# Patient Record
Sex: Male | Born: 1945 | Race: White | Hispanic: No | Marital: Married | Smoking: Current every day smoker
Health system: Southern US, Community
[De-identification: ages and names within clinical notes are randomized; demographics above are authoritative.]

## PROBLEM LIST (undated history)

## (undated) DIAGNOSIS — G8929 Other chronic pain: Secondary | ICD-10-CM

## (undated) DIAGNOSIS — K219 Gastro-esophageal reflux disease without esophagitis: Secondary | ICD-10-CM

## (undated) DIAGNOSIS — K589 Irritable bowel syndrome without diarrhea: Secondary | ICD-10-CM

## (undated) DIAGNOSIS — J449 Chronic obstructive pulmonary disease, unspecified: Secondary | ICD-10-CM

## (undated) DIAGNOSIS — J9819 Other pulmonary collapse: Secondary | ICD-10-CM

## (undated) DIAGNOSIS — M549 Dorsalgia, unspecified: Secondary | ICD-10-CM

## (undated) DIAGNOSIS — M1990 Unspecified osteoarthritis, unspecified site: Secondary | ICD-10-CM

## (undated) HISTORY — PX: SKIN GRAFT: SHX250

---

## 2003-07-29 ENCOUNTER — Emergency Department (HOSPITAL_COMMUNITY): Admission: EM | Admit: 2003-07-29 | Discharge: 2003-07-30 | Payer: Self-pay | Admitting: Emergency Medicine

## 2004-08-10 ENCOUNTER — Emergency Department (HOSPITAL_COMMUNITY): Admission: EM | Admit: 2004-08-10 | Discharge: 2004-08-10 | Payer: Self-pay | Admitting: Emergency Medicine

## 2005-12-16 DIAGNOSIS — S2249XA Multiple fractures of ribs, unspecified side, initial encounter for closed fracture: Secondary | ICD-10-CM | POA: Insufficient documentation

## 2007-11-03 DIAGNOSIS — J449 Chronic obstructive pulmonary disease, unspecified: Secondary | ICD-10-CM

## 2007-11-03 DIAGNOSIS — J4489 Other specified chronic obstructive pulmonary disease: Secondary | ICD-10-CM | POA: Insufficient documentation

## 2008-03-04 ENCOUNTER — Ambulatory Visit: Payer: Self-pay | Admitting: Nurse Practitioner

## 2008-03-04 DIAGNOSIS — S99919A Unspecified injury of unspecified ankle, initial encounter: Secondary | ICD-10-CM

## 2008-03-04 DIAGNOSIS — S99929A Unspecified injury of unspecified foot, initial encounter: Secondary | ICD-10-CM

## 2008-03-04 DIAGNOSIS — S8990XA Unspecified injury of unspecified lower leg, initial encounter: Secondary | ICD-10-CM | POA: Insufficient documentation

## 2008-03-04 DIAGNOSIS — L03019 Cellulitis of unspecified finger: Secondary | ICD-10-CM

## 2008-03-04 DIAGNOSIS — M79609 Pain in unspecified limb: Secondary | ICD-10-CM

## 2008-03-04 DIAGNOSIS — E299 Testicular dysfunction, unspecified: Secondary | ICD-10-CM | POA: Insufficient documentation

## 2008-03-08 ENCOUNTER — Ambulatory Visit (HOSPITAL_COMMUNITY): Admission: RE | Admit: 2008-03-08 | Discharge: 2008-03-08 | Payer: Self-pay | Admitting: Internal Medicine

## 2008-03-08 DIAGNOSIS — F17200 Nicotine dependence, unspecified, uncomplicated: Secondary | ICD-10-CM

## 2008-03-16 ENCOUNTER — Ambulatory Visit: Payer: Self-pay | Admitting: Nurse Practitioner

## 2008-03-16 DIAGNOSIS — M5137 Other intervertebral disc degeneration, lumbosacral region: Secondary | ICD-10-CM

## 2008-03-16 DIAGNOSIS — H269 Unspecified cataract: Secondary | ICD-10-CM

## 2008-03-16 LAB — CONVERTED CEMR LAB
ALT: <8 units/L (ref 0–53)
AST: 13 units/L (ref 0–37)
Alkaline Phosphatase: 80 units/L (ref 39–117)
Basophils Absolute: 0 10*3/uL (ref 0.0–0.1)
Basophils Relative: 0 % (ref 0–1)
Chloride: 103 meq/L (ref 96–112)
Creatinine, Ser: 0.8 mg/dL (ref 0.40–1.50)
Eosinophils Relative: 3 % (ref 0–5)
Hemoglobin: 14.2 g/dL (ref 13.0–17.0)
LDL Cholesterol: 89 mg/dL (ref 0–99)
MCHC: 33.6 g/dL (ref 30.0–36.0)
Monocytes Absolute: 0.9 10*3/uL (ref 0.1–1.0)
Neutro Abs: 6.4 10*3/uL (ref 1.7–7.7)
RDW: 12.7 % (ref 11.5–15.5)
TSH: 0.979 microintl units/mL (ref 0.350–5.50)
Total Bilirubin: 0.6 mg/dL (ref 0.3–1.2)
Total CHOL/HDL Ratio: 3.1
VLDL: 18 mg/dL (ref 0–40)

## 2008-03-17 ENCOUNTER — Encounter (INDEPENDENT_AMBULATORY_CARE_PROVIDER_SITE_OTHER): Payer: Self-pay | Admitting: Nurse Practitioner

## 2008-03-23 ENCOUNTER — Encounter (INDEPENDENT_AMBULATORY_CARE_PROVIDER_SITE_OTHER): Payer: Self-pay | Admitting: Nurse Practitioner

## 2008-04-06 ENCOUNTER — Encounter (INDEPENDENT_AMBULATORY_CARE_PROVIDER_SITE_OTHER): Payer: Self-pay | Admitting: Nurse Practitioner

## 2008-04-12 ENCOUNTER — Encounter (INDEPENDENT_AMBULATORY_CARE_PROVIDER_SITE_OTHER): Payer: Self-pay | Admitting: Urology

## 2008-04-12 ENCOUNTER — Ambulatory Visit (HOSPITAL_BASED_OUTPATIENT_CLINIC_OR_DEPARTMENT_OTHER): Admission: RE | Admit: 2008-04-12 | Discharge: 2008-04-12 | Payer: Self-pay | Admitting: Urology

## 2008-04-15 ENCOUNTER — Ambulatory Visit: Payer: Self-pay | Admitting: Nurse Practitioner

## 2008-04-25 ENCOUNTER — Telehealth (INDEPENDENT_AMBULATORY_CARE_PROVIDER_SITE_OTHER): Payer: Self-pay | Admitting: Nurse Practitioner

## 2008-04-29 ENCOUNTER — Encounter (INDEPENDENT_AMBULATORY_CARE_PROVIDER_SITE_OTHER): Payer: Self-pay | Admitting: Nurse Practitioner

## 2008-05-02 ENCOUNTER — Telehealth (INDEPENDENT_AMBULATORY_CARE_PROVIDER_SITE_OTHER): Payer: Self-pay | Admitting: Nurse Practitioner

## 2008-05-19 ENCOUNTER — Encounter (INDEPENDENT_AMBULATORY_CARE_PROVIDER_SITE_OTHER): Payer: Self-pay | Admitting: Nurse Practitioner

## 2008-05-25 ENCOUNTER — Encounter (INDEPENDENT_AMBULATORY_CARE_PROVIDER_SITE_OTHER): Payer: Self-pay | Admitting: Nurse Practitioner

## 2008-05-27 ENCOUNTER — Ambulatory Visit: Payer: Self-pay | Admitting: Nurse Practitioner

## 2008-05-30 ENCOUNTER — Telehealth (INDEPENDENT_AMBULATORY_CARE_PROVIDER_SITE_OTHER): Payer: Self-pay | Admitting: *Deleted

## 2008-06-14 ENCOUNTER — Encounter (INDEPENDENT_AMBULATORY_CARE_PROVIDER_SITE_OTHER): Payer: Self-pay | Admitting: Nurse Practitioner

## 2008-06-21 ENCOUNTER — Encounter (INDEPENDENT_AMBULATORY_CARE_PROVIDER_SITE_OTHER): Payer: Self-pay | Admitting: Nurse Practitioner

## 2008-06-24 ENCOUNTER — Ambulatory Visit: Payer: Self-pay | Admitting: Nurse Practitioner

## 2008-06-24 DIAGNOSIS — N3000 Acute cystitis without hematuria: Secondary | ICD-10-CM

## 2008-06-24 LAB — CONVERTED CEMR LAB
Protein, U semiquant: 30
Urobilinogen, UA: 0.2

## 2008-06-29 ENCOUNTER — Encounter (INDEPENDENT_AMBULATORY_CARE_PROVIDER_SITE_OTHER): Payer: Self-pay | Admitting: Nurse Practitioner

## 2008-07-01 ENCOUNTER — Ambulatory Visit (HOSPITAL_COMMUNITY): Admission: RE | Admit: 2008-07-01 | Discharge: 2008-07-01 | Payer: Self-pay | Admitting: Internal Medicine

## 2008-07-04 ENCOUNTER — Encounter (INDEPENDENT_AMBULATORY_CARE_PROVIDER_SITE_OTHER): Payer: Self-pay | Admitting: Nurse Practitioner

## 2011-04-30 NOTE — Op Note (Signed)
[name], [name]                ACCOUNT NO.:  [id number]   MEDICAL RECORD NO.:  [id number]          PATIENT TYPE:  AMB   LOCATION:  [facility]                         FACILITY:  [facility]   PHYSICIAN:  Lindaann Slough, M.D.  DATE OF BIRTH:  [birth year]   DATE OF PROCEDURE:  04/12/2008  DATE OF DISCHARGE:                               OPERATIVE REPORT   PREOPERATIVE DIAGNOSIS:  Left spermatocele.   POSTOPERATIVE DIAGNOSIS:  Left spermatocele.   PROCEDURE:  Left spermatocelectomy.   ATTENDING PHYSICIAN:  Danae Chen, M.D.   RESIDENT PHYSICIAN:  Dr. Delman Kitten.   ANESTHESIA:  General.   INDICATIONS FOR PROCEDURE:  The patient is a 65 year old white male who  has been followed by Dr. Brunilda Payor in clinic for a cyst-like mass in his left  hemiscrotum that is separate from his testicle.  It has been evaluated  thoroughly in the clinic and he has failed conservative management.  He  reports that is increasing in size and increasing in pain and requested  something else be done.  Surgical options were discussed with him  including risks, benefits, and consequences, and informed consent was  obtained preoperatively.   PROCEDURE IN DETAIL:  The patient is brought to the operating room,  placed in supine position.  He was correctly identified by his wristband  and appropriate time-out was taken.  IV antibiotics were administered  and general anesthesia was delivered.  Once adequately anesthetized, his  scrotum was clipped, prepped, and draped sterilely.  We began our  procedure by performing a thorough physical exam.  His right testicle  was palpably normal.  His left hemiscrotum demonstrated two masses, both  of approximately equal size and based on Dr. Madilyn Hook clinic exam, the  more cephalad and anterior mass was the mass in question.  He states  that it transilluminated in the clinic.  To palpation they felt similar.  We subsequently made a vertical incision in the left hemiscrotum over  the left  mass.  The incision was superficial.  It was carried down  deeper with Metzenbaum scissors.  We got down to the tunica vaginalis.  It was entered sharply and then we subsequently delivered the mass and  the testicle out of the left hemiscrotum.  We thoroughly inspected the  structures.  His left testicle and left epididymis, vas, and spermatic  cord all appeared normal.  This mass in question was approximately 4 cm  in length.  It was cystic and fluid-filled and soft, but it also had  somewhat of a lumpy, bumpy, irregular surface.  It appeared as if the  fluid inside of is amber-colored.  There was a defined plane in between  the head of the epididymis, the testicle, and the mass.  Likewise, we  subsequently developed this plane sharply using Metzenbaum scissors,  taking great care to not enter the cyst, as well as to avoid damaging  the testicle and epididymis.  We were successfully able to amputate the  cyst away from the testicle and epididymis without any harm to any of  the three structures.  The spermatocele was  passed off of the table.  We  then closely inspected all the normal anatomic structures.  There was no  injury appreciated.  All bleeding sites were addressed with  electrocautery.  The epididymal head was slightly disarticulated  from  its attachment to the scrotum, so we subsequently reapproximated it, as  well  as reapproximated the external spermatic fascia with one simple  running suture using 3-0 chromic taking great care to only reapproximate  the fascia and not to incorporate the deeper structures.  Once this was  done, we re-inspected for hemostasis.  There was no active bleeding so  likewise we re-delivered the testicle and spermatic cord and epididymis  back into the left hemiscrotum and closed it in two layers, taking great  care to eliminate any possible dead space that would allow a hematoma to  form.  The skin was closed with a simple running suture using 3-0   chromic and then collodion was placed on top of this suture, fluff  dressing was applied once the collodion was dry, and this marked the end  of our procedure.  He tolerated the procedure well.  There were no  complications.  Dr. Brunilda Payor was present and participated in all aspects of  the case.      Melina Schools, MD      Lindaann Slough, M.D.  Electronically Signed    JR/MEDQ  D:  04/12/2008  T:  04/12/2008  Job:  [id number]   cc:   Lindaann Slough, M.D.  Fax: [phone]

## 2011-09-10 LAB — URINALYSIS, ROUTINE W REFLEX MICROSCOPIC
Bilirubin Urine: NEGATIVE
Glucose, UA: NEGATIVE
Ketones, ur: NEGATIVE
Protein, ur: NEGATIVE
pH: 6.5

## 2011-09-10 LAB — URINE CULTURE: Colony Count: NO GROWTH

## 2011-09-10 LAB — COMPREHENSIVE METABOLIC PANEL
Albumin: 4
BUN: 6
Chloride: 101
Creatinine, Ser: 1
Glucose, Bld: 101 — ABNORMAL HIGH
Total Bilirubin: 1.1

## 2011-09-10 LAB — PSA: PSA: 1.16

## 2011-09-10 LAB — CBC
HCT: 42.4
Hemoglobin: 14.2
MCV: 93.3
Platelets: 257
RDW: 13.1

## 2011-09-10 LAB — URINE MICROSCOPIC-ADD ON

## 2011-09-10 LAB — PROTIME-INR: Prothrombin Time: 13.4

## 2011-09-10 LAB — APTT: aPTT: 33

## 2013-01-07 ENCOUNTER — Encounter (HOSPITAL_COMMUNITY): Payer: Self-pay | Admitting: *Deleted

## 2013-01-07 ENCOUNTER — Emergency Department (HOSPITAL_COMMUNITY): Payer: Medicare Other

## 2013-01-07 ENCOUNTER — Inpatient Hospital Stay (HOSPITAL_COMMUNITY)
Admission: EM | Admit: 2013-01-07 | Discharge: 2013-01-10 | DRG: 432 | Disposition: A | Discharge status: Home / Self Care | Payer: Medicare Other | Attending: Internal Medicine | Admitting: Internal Medicine

## 2013-01-07 DIAGNOSIS — Z681 Body mass index (BMI) 19 or less, adult: Secondary | ICD-10-CM

## 2013-01-07 DIAGNOSIS — Z8249 Family history of ischemic heart disease and other diseases of the circulatory system: Secondary | ICD-10-CM

## 2013-01-07 DIAGNOSIS — Z79899 Other long term (current) drug therapy: Secondary | ICD-10-CM

## 2013-01-07 DIAGNOSIS — N3000 Acute cystitis without hematuria: Secondary | ICD-10-CM

## 2013-01-07 DIAGNOSIS — F17200 Nicotine dependence, unspecified, uncomplicated: Secondary | ICD-10-CM

## 2013-01-07 DIAGNOSIS — F1020 Alcohol dependence, uncomplicated: Secondary | ICD-10-CM

## 2013-01-07 DIAGNOSIS — E43 Unspecified severe protein-calorie malnutrition: Secondary | ICD-10-CM

## 2013-01-07 DIAGNOSIS — K219 Gastro-esophageal reflux disease without esophagitis: Secondary | ICD-10-CM

## 2013-01-07 DIAGNOSIS — R188 Other ascites: Secondary | ICD-10-CM

## 2013-01-07 DIAGNOSIS — J449 Chronic obstructive pulmonary disease, unspecified: Secondary | ICD-10-CM | POA: Diagnosis present

## 2013-01-07 DIAGNOSIS — K7030 Alcoholic cirrhosis of liver without ascites: Principal | ICD-10-CM | POA: Diagnosis present

## 2013-01-07 DIAGNOSIS — M51379 Other intervertebral disc degeneration, lumbosacral region without mention of lumbar back pain or lower extremity pain: Secondary | ICD-10-CM | POA: Diagnosis present

## 2013-01-07 DIAGNOSIS — K766 Portal hypertension: Secondary | ICD-10-CM | POA: Diagnosis present

## 2013-01-07 DIAGNOSIS — Z66 Do not resuscitate: Secondary | ICD-10-CM | POA: Diagnosis present

## 2013-01-07 DIAGNOSIS — N390 Urinary tract infection, site not specified: Secondary | ICD-10-CM

## 2013-01-07 DIAGNOSIS — G8929 Other chronic pain: Secondary | ICD-10-CM | POA: Diagnosis present

## 2013-01-07 DIAGNOSIS — E871 Hypo-osmolality and hyponatremia: Secondary | ICD-10-CM

## 2013-01-07 DIAGNOSIS — K7460 Unspecified cirrhosis of liver: Secondary | ICD-10-CM

## 2013-01-07 DIAGNOSIS — J4489 Other specified chronic obstructive pulmonary disease: Secondary | ICD-10-CM | POA: Diagnosis present

## 2013-01-07 DIAGNOSIS — F1010 Alcohol abuse, uncomplicated: Secondary | ICD-10-CM

## 2013-01-07 DIAGNOSIS — M5137 Other intervertebral disc degeneration, lumbosacral region: Secondary | ICD-10-CM | POA: Diagnosis present

## 2013-01-07 HISTORY — DX: Unspecified osteoarthritis, unspecified site: M19.90

## 2013-01-07 HISTORY — DX: Other pulmonary collapse: J98.19

## 2013-01-07 HISTORY — DX: Dorsalgia, unspecified: M54.9

## 2013-01-07 HISTORY — DX: Gastro-esophageal reflux disease without esophagitis: K21.9

## 2013-01-07 HISTORY — DX: Chronic obstructive pulmonary disease, unspecified: J44.9

## 2013-01-07 HISTORY — DX: Irritable bowel syndrome, unspecified: K58.9

## 2013-01-07 HISTORY — DX: Other chronic pain: G89.29

## 2013-01-07 LAB — PROTIME-INR
INR: 1.56 — ABNORMAL HIGH (ref 0.00–1.49)
Prothrombin Time: 18.2 s — ABNORMAL HIGH (ref 11.6–15.2)

## 2013-01-07 LAB — COMPREHENSIVE METABOLIC PANEL WITH GFR
AST: 109 U/L — ABNORMAL HIGH (ref 0–37)
Albumin: 3.2 g/dL — ABNORMAL LOW (ref 3.5–5.2)
Chloride: 85 meq/L — ABNORMAL LOW (ref 96–112)
Creatinine, Ser: 0.48 mg/dL — ABNORMAL LOW (ref 0.50–1.35)
Potassium: 3.5 meq/L (ref 3.5–5.1)
Total Bilirubin: 2.3 mg/dL — ABNORMAL HIGH (ref 0.3–1.2)
Total Protein: 7.2 g/dL (ref 6.0–8.3)

## 2013-01-07 LAB — CBC WITH DIFFERENTIAL/PLATELET
Basophils Absolute: 0 K/uL (ref 0.0–0.1)
Basophils Relative: 1 % (ref 0–1)
Eosinophils Absolute: 0.1 K/uL (ref 0.0–0.7)
Eosinophils Relative: 1 % (ref 0–5)
HCT: 36.7 % — ABNORMAL LOW (ref 39.0–52.0)
Hemoglobin: 13.2 g/dL (ref 13.0–17.0)
Lymphocytes Relative: 19 % (ref 12–46)
Lymphs Abs: 1 10*3/uL (ref 0.7–4.0)
MCH: 34.2 pg — ABNORMAL HIGH (ref 26.0–34.0)
MCHC: 36 g/dL (ref 30.0–36.0)
MCV: 95.1 fL (ref 78.0–100.0)
Monocytes Absolute: 1.2 K/uL — ABNORMAL HIGH (ref 0.1–1.0)
Monocytes Relative: 22 % — ABNORMAL HIGH (ref 3–12)
Neutro Abs: 3.1 K/uL (ref 1.7–7.7)
Neutrophils Relative %: 57 % (ref 43–77)
Platelets: 162 10*3/uL (ref 150–400)
RBC: 3.86 MIL/uL — ABNORMAL LOW (ref 4.22–5.81)
RDW: 14 % (ref 11.5–15.5)
WBC: 5.4 10*3/uL (ref 4.0–10.5)

## 2013-01-07 LAB — URINALYSIS, ROUTINE W REFLEX MICROSCOPIC
Glucose, UA: NEGATIVE mg/dL
Ketones, ur: 15 mg/dL — AB
Nitrite: POSITIVE — AB
Protein, ur: 30 mg/dL — AB
Specific Gravity, Urine: 1.021 (ref 1.005–1.030)
Urobilinogen, UA: 4 mg/dL — ABNORMAL HIGH (ref 0.0–1.0)
pH: 6 (ref 5.0–8.0)

## 2013-01-07 LAB — COMPREHENSIVE METABOLIC PANEL
ALT: 27 U/L (ref 0–53)
Alkaline Phosphatase: 127 U/L — ABNORMAL HIGH (ref 39–117)
BUN: 4 mg/dL — ABNORMAL LOW (ref 6–23)
CO2: 28 mEq/L (ref 19–32)
Calcium: 9.3 mg/dL (ref 8.4–10.5)
GFR calc Af Amer: >90 mL/min (ref >90)
GFR calc non Af Amer: >90 mL/min (ref >90)
Glucose, Bld: 92 mg/dL (ref 70–99)
Sodium: 129 mEq/L — ABNORMAL LOW (ref 135–145)

## 2013-01-07 LAB — LIPASE, BLOOD: Lipase: 26 U/L (ref 11–59)

## 2013-01-07 LAB — ETHANOL: Alcohol, Ethyl (B): <11 mg/dL (ref 0–11)

## 2013-01-07 LAB — APTT: aPTT: 38 s — ABNORMAL HIGH (ref 24–37)

## 2013-01-07 LAB — URINE MICROSCOPIC-ADD ON

## 2013-01-07 MED ORDER — IPRATROPIUM BROMIDE 0.02 % IN SOLN: 0.5000 mg | Freq: Four times a day (QID) | RESPIRATORY_TRACT | Status: DC | PRN

## 2013-01-07 MED ORDER — LORAZEPAM 2 MG/ML IJ SOLN
0.0000 mg | Freq: Four times a day (QID) | INTRAMUSCULAR | Status: AC
  Administered 2013-01-07: 2 mg via INTRAVENOUS
  Filled 2013-01-07: qty 1

## 2013-01-07 MED ORDER — SODIUM CHLORIDE 0.9 % IV SOLN
INTRAVENOUS | Status: DC
  Administered 2013-01-07: 19:00:00 via INTRAVENOUS

## 2013-01-07 MED ORDER — SODIUM CHLORIDE 0.9 % IJ SOLN
3.0000 mL | Freq: Two times a day (BID) | INTRAMUSCULAR | Status: DC
  Administered 2013-01-09: 3 mL via INTRAVENOUS

## 2013-01-07 MED ORDER — PANTOPRAZOLE SODIUM 40 MG PO TBEC
40.0000 mg | DELAYED_RELEASE_TABLET | Freq: Two times a day (BID) | ORAL | Status: DC
  Administered 2013-01-07 – 2013-01-10 (×6): 40 mg via ORAL
  Filled 2013-01-07 (×5): qty 1

## 2013-01-07 MED ORDER — CEFTRIAXONE SODIUM 1 G IJ SOLR
1.0000 g | Freq: Once | INTRAMUSCULAR | Status: AC
  Administered 2013-01-07: 1 g via INTRAVENOUS
  Filled 2013-01-07: qty 10

## 2013-01-07 MED ORDER — ACETAMINOPHEN 325 MG PO TABS
325.0000 mg | ORAL_TABLET | Freq: Four times a day (QID) | ORAL | Status: DC | PRN
  Administered 2013-01-07: 325 mg via ORAL
  Filled 2013-01-07: qty 1

## 2013-01-07 MED ORDER — ONDANSETRON HCL 4 MG/2ML IJ SOLN: 4.0000 mg | Freq: Four times a day (QID) | INTRAMUSCULAR | Status: DC | PRN

## 2013-01-07 MED ORDER — SODIUM CHLORIDE 0.9 % IV SOLN
Freq: Once | INTRAVENOUS | Status: AC
  Administered 2013-01-07: 17:00:00 via INTRAVENOUS

## 2013-01-07 MED ORDER — LORAZEPAM 2 MG/ML IJ SOLN: 1.0000 mg | Freq: Four times a day (QID) | INTRAMUSCULAR | Status: DC | PRN

## 2013-01-07 MED ORDER — NICOTINE 14 MG/24HR TD PT24
14.0000 mg | MEDICATED_PATCH | Freq: Every day | TRANSDERMAL | Status: DC
  Administered 2013-01-07 – 2013-01-10 (×4): 14 mg via TRANSDERMAL
  Filled 2013-01-07 (×4): qty 1

## 2013-01-07 MED ORDER — FUROSEMIDE 40 MG PO TABS
40.0000 mg | ORAL_TABLET | Freq: Every day | ORAL | Status: DC
  Administered 2013-01-08 – 2013-01-10 (×3): 40 mg via ORAL
  Filled 2013-01-07 (×3): qty 1

## 2013-01-07 MED ORDER — LORAZEPAM 1 MG PO TABS
1.0000 mg | ORAL_TABLET | Freq: Four times a day (QID) | ORAL | Status: DC | PRN
  Administered 2013-01-10: 1 mg via ORAL
  Filled 2013-01-07: qty 1

## 2013-01-07 MED ORDER — VITAMIN B-1 100 MG PO TABS
100.0000 mg | ORAL_TABLET | Freq: Every day | ORAL | Status: DC
  Administered 2013-01-07 – 2013-01-10 (×4): 100 mg via ORAL
  Filled 2013-01-07 (×4): qty 1

## 2013-01-07 MED ORDER — ALBUTEROL SULFATE (5 MG/ML) 0.5% IN NEBU: 2.5000 mg | INHALATION_SOLUTION | Freq: Four times a day (QID) | RESPIRATORY_TRACT | Status: DC | PRN

## 2013-01-07 MED ORDER — ONDANSETRON HCL 4 MG PO TABS: 4.0000 mg | ORAL_TABLET | Freq: Four times a day (QID) | ORAL | Status: DC | PRN

## 2013-01-07 MED ORDER — MORPHINE SULFATE 2 MG/ML IJ SOLN
1.0000 mg | INTRAMUSCULAR | Status: DC | PRN
  Administered 2013-01-10: 1 mg via INTRAVENOUS
  Filled 2013-01-07: qty 1

## 2013-01-07 MED ORDER — LORAZEPAM 2 MG/ML IJ SOLN: 0.0000 mg | Freq: Two times a day (BID) | INTRAMUSCULAR | Status: DC

## 2013-01-07 MED ORDER — ADULT MULTIVITAMIN W/MINERALS CH
1.0000 | ORAL_TABLET | Freq: Every day | ORAL | Status: DC
  Administered 2013-01-07 – 2013-01-10 (×4): 1 via ORAL
  Filled 2013-01-07 (×4): qty 1

## 2013-01-07 MED ORDER — SPIRONOLACTONE 50 MG PO TABS
50.0000 mg | ORAL_TABLET | Freq: Two times a day (BID) | ORAL | Status: DC
  Administered 2013-01-07 – 2013-01-10 (×6): 50 mg via ORAL
  Filled 2013-01-07 (×8): qty 1

## 2013-01-07 MED ORDER — ENSURE COMPLETE PO LIQD
237.0000 mL | Freq: Two times a day (BID) | ORAL | Status: DC
  Administered 2013-01-08 – 2013-01-10 (×4): 237 mL via ORAL

## 2013-01-07 MED ORDER — THIAMINE HCL 100 MG/ML IJ SOLN
100.0000 mg | Freq: Every day | INTRAMUSCULAR | Status: DC
  Filled 2013-01-07 (×2): qty 1

## 2013-01-07 MED ORDER — ACETAMINOPHEN 650 MG RE SUPP: 325.0000 mg | Freq: Four times a day (QID) | RECTAL | Status: DC | PRN

## 2013-01-07 MED ORDER — FOLIC ACID 1 MG PO TABS
1.0000 mg | ORAL_TABLET | Freq: Every day | ORAL | Status: DC
  Administered 2013-01-07 – 2013-01-10 (×4): 1 mg via ORAL
  Filled 2013-01-07 (×4): qty 1

## 2013-01-07 NOTE — ED Notes (Signed)
Patient transported to Ultrasound 

## 2013-01-07 NOTE — ED Notes (Signed)
Pt to ED c/o abd pain and constipation since before Christmas.  Hx of IBS.  Last "normal" bm was 1 month ago.  Last bm was 1 "pellet" yesterday.  Pt also c/o burning on urination and dyuria.   States the only thing he is able to keep down without vomiting is beer. Drinks 4 per day.  BS in all 4 quads.

## 2013-01-07 NOTE — H&P (Signed)
[facility] History and Physical  KATO WIECZOREK NWG:[id number] DOB: [birth year] DOA: 01/07/2013  Referring physician: Dr. Oletta Lamas PCP: No primary provider on file.    Chief Complaint: abdominal pain, ascites, anorexia, weakness  HPI: [name] is a 67 y.o. male with past medical history significant COPD, tobacco abuse, chronic back pain, ulcer arthritis, alcohol abuse and GERD; came to the hospital secondary to abdominal pain. Patient reports left side and epigastric abdominal pain intermittently [place] for approximately 3-4 weeks prior to admission. Pain has progressively worsened and he has also noticed increase distention on his daily. Patient associated weight loss, poor appetite, early satiety due to his abdominal distention, and increase weakness. He reports that he continued to be drinking approximately 3-5 beers daily. Able to tolerate fluids only at this moment. Patient denies nausea, vomiting, diarrhea, shortness of breath, fever, chills, night sweats, cough or any other acute complaints.   Review of Systems:  Negative except as otherwise mentioned on history of present illness.  Past Medical History  Diagnosis Date  . COPD (chronic obstructive pulmonary disease)   . Acid reflux   . IBS (irritable bowel syndrome)   . Chronic back pain   . Arthritis   . Collapsed lung    Past Surgical History  Procedure Date  . Skin graft   . Skin graft    Social History:  reports that he has been smoking Cigarettes.  He has been smoking about .5 packs per day. He does not have any smokeless tobacco history on file. He reports that he drinks about 21 ounces of alcohol per week. He reports that he does not use illicit drugs. patient was living at home by himself, no need for assistance with activities of daily living on stool one week prior to admission approximately when he started having significant weakness and also anorexia, due to increase size and pain on his abdomen    No Known  Allergies  Family history: Significant only for hypertension and colon cancer according to the patient.  Prior to Admission medications   Medication Sig Start Date End Date Taking? Authorizing Provider  Aspirin-Acetaminophen-Caffeine (GOODY HEADACHE PO) Take 1 packet by mouth daily as needed. For pain   Yes Historical Provider, MD  famotidine (PEPCID) 20 MG tablet Take 20 mg by mouth 2 (two) times daily.   Yes Historical Provider, MD  Multiple Vitamin (MULTIVITAMIN WITH MINERALS) TABS Take 1 tablet by mouth daily.   Yes Historical Provider, MD   Physical Exam: Filed Vitals:   01/07/13 1211 01/07/13 1400 01/07/13 1535  BP: 148/84 132/83 139/80  Pulse: 115 112 106  Temp: 98.1 F (36.7 C) 98.7 F (37.1 C)   TempSrc: Oral Oral   Resp: 18 21 22   SpO2: 94% 100% 97%     General:  Mild distress secondary to abdominal discomfort, no jaundice, able to follow commands, alert, awake and oriented x3.  Eyes: No icterus, PERRLA, extraocular muscles intact, no nystagmus.  ENT: No erythema or exudate inside his mouth, no drainage out of ears or nostrils.   Neck: Supple, no thyromegaly, no bruits.   Cardiovascular: Tachycardic, no rubs, no gallops, no murmurs.  Respiratory: Clear to auscultation bilaterally.  Abdomen: Belly distended, positive fluid wave, warm to touch, tender to palpation.  Skin: No rash, no petechiae, lower extremities with dry skin.  Musculoskeletal: No joint erythema or swelling, mild before and joints of his hands secondary to arthritis.  Psychiatric: No suicidal ideation, no hallucinations.  Neurologic: No focal neurologic  deficit. Cranial nerve grossly intact, negative asterixis, muscle strength 4-5 bilaterally symmetrically secondary to poor effort.  Labs on Admission:  Basic Metabolic Panel:  Lab 01/07/13 1610  NA 129*  K 3.5  CL 85*  CO2 28  GLUCOSE 92  BUN 4*  CREATININE 0.48*  CALCIUM 9.3  MG --  PHOS --   Liver Function Tests:  Lab 01/07/13  1235  AST 109*  ALT 27  ALKPHOS 127*  BILITOT 2.3*  PROT 7.2  ALBUMIN 3.2*    Lab 01/07/13 1235  LIPASE 26  AMYLASE --   CBC:  Lab 01/07/13 1235  WBC 5.4  NEUTROABS 3.1  HGB 13.2  HCT 36.7*  MCV 95.1  PLT 162    Radiological Exams on Admission: US Abdomen Complete  01/07/2013  *RADIOLOGY REPORT*  Clinical Data:  Abdominal pain  ULTRASOUND ABDOMEN:  Technique:  Sonography of upper abdominal structures was performed.  Comparison:  None  Gallbladder:  Thickened gallbladder wall up to 4 mm thick, a nonspecific finding in the setting of ascites.  No definite shadowing calculi or sonographic Murphy's sign.  Common bile duct:  Normal caliber 5 mm diameter.  Liver:  Echogenic with nodular margins consistent with cirrhosis. Hepatopetal portal venous flow.  No definite focal hepatic mass identified.  IVC:  Normal appearance  Pancreas:  Normal appearance  Spleen:  Normal size morphology, 7.0 cm length  Right kidney:  10.7 cm length. Normal size without mass or hydronephrosis. Upper normal cortical echogenicity.  Left kidney:  10.0 cm length.  Less well visualized due to body habitus.  No gross mass or hydronephrosis.  Aorta:  Normal caliber proximally, obscured by bowel gas distally. The  Other:  Significant ascites throughout abdomen.  IMPRESSION: Cirrhotic appearing liver with significant ascites. Thickened gallbladder wall, a nonspecific finding in the setting of ascites; no gallstones or sonographic Murphy sign identified.   Original Report Authenticated By: Ulyses Southward, M.D.    Dg Chest Port 1 View  01/07/2013  *RADIOLOGY REPORT*  Clinical Data: COPD.  Weakness.  Smoker.  Abdominal distention.  PORTABLE CHEST - 1 VIEW  Comparison: None.  Findings: Lordotic positioning noted.  Pulmonary hyperinflation is suspicious for COPD.  Scarring is noted in the left lung base.  No evidence of pulmonary infiltrate or edema.  No evidence of pleural effusion or pneumothorax.  Old right rib fracture  deformities are noted.  Heart size is normal.  No mass or lymphadenopathy identified.  IMPRESSION: COPD and left lower lobe scarring.  No acute findings.   Original Report Authenticated By: Myles Rosenthal, M.D.     Assessment/Plan 1-abdominal pain: Patient with significant abdominal pain and distention most likely secondary to ascites. Had a history of alcohol abuse and ultrasound in the emergency department he is demonstrated cirrhosis of the liver. MELD score 14 -Will admit patient to the hospital (telemetry bed do to high risk for withdrawal from alcohol and already tachycardiac) -Will perform an paracentesis diagnostic (2 rule out SBP) and also therapeutic at this point he distended and uncomfortable for the patient. -Rocephin empirically for a presumed SBP and also ongoing UTI. -Follow bloody fluid studies. -Start Lasix and spironolactone. -Patient instructed/advised to stop drinking and will benefit of refill as an outpatient to hepatology clinic.  2-UTI: Will treat empirically with Rocephin. Will follow urine culture.  3-TOBACCO ABUSE: Counseling provided. Nicotine patch prescribed.  4-CHRONIC OBSTRUCTIVE PULMONARY DISEASE, MODERATE: Currently no wheezing or complaining of any shortness of breath. Will use when necessary Duoneb nebulizer.  5-DISC DISEASE, LUMBOSACRAL SPINE: When necessary analgesics.  6-Alcohol abuse: Alcohol cessation counseling has been provided. Will follow CIWA protocol.  7-Ascites: As mentioned above most likely secondary to increase portal hypertension do to his cirrhosis. Will ask for ultrasound guided paracentesis, do to ongoing pain and warm sensation and physical exam of his belly will start treatment empirically with Rocephin to cover for SBP. Start Lasix and spironolactone.  7-Severe protein-calorie malnutrition: Started on ensure twice a day.  8-Hyponatremia: Most likely secondary to cirrhosis and alcohol abuse, other consideration will be hemodilution with  ongoing ascites. Patient is asymptomatic at this moment. Will start treatment with Lasix and spironolactone and will follow electrolytes trend.  9-GERD (gastroesophageal reflux disease): Started on PPI twice a day.  DVT: SCDs.  Code Status: DO NOT RESUSCITATE. Family Communication: no family at bedside Disposition Plan: Admit to inpatient for further treatment of his abdominal pain , telemetry bed for high risk withdrawal from alcohol abuse and ongoing tachycardia. Length of stay more than 2 midnights.  Time spent: >30 minutes  Marrion Accomando [facility] Pager [phone]  If 7PM-7AM, please contact night-coverage [email or web address] Password [email or web address] 01/07/2013, 7:03 PM

## 2013-01-07 NOTE — ED Provider Notes (Signed)
Assumed  care of patient at change of shift to get ultrasound report and admission for patient, patient reports he started having pain in his abdomen a couple months ago but changed from side to side and then about a month ago he started having progressively worsening swelling of his abdomen. He states when he was younger he used to drink much heavier however now he drinks about 4 regular-sized cans of beer a day. Patient reports he has been able to eat and he is only able to drink beer.  Patient has distended abdomen that is consistent with ascites. He appears to be nontender to palpation.Pt appears cachetic.   I have discussed patient's ultrasound result with him and given him the diagnosis of cirrhosis. I have also impressed on him he needs to quit drinking all alcohol.  17:24 Dr Gwenlyn Perking, admit to tele, team 9  US Abdomen Complete  01/07/2013  *RADIOLOGY REPORT*  Clinical Data:  Abdominal pain  ULTRASOUND ABDOMEN:  Technique:  Sonography of upper abdominal structures was performed.  Comparison:  None  Gallbladder:  Thickened gallbladder wall up to 4 mm thick, a nonspecific finding in the setting of ascites.  No definite shadowing calculi or sonographic Murphy's sign.  Common bile duct:  Normal caliber 5 mm diameter.  Liver:  Echogenic with nodular margins consistent with cirrhosis. Hepatopetal portal venous flow.  No definite focal hepatic mass identified.  IVC:  Normal appearance  Pancreas:  Normal appearance  Spleen:  Normal size morphology, 7.0 cm length  Right kidney:  10.7 cm length. Normal size without mass or hydronephrosis. Upper normal cortical echogenicity.  Left kidney:  10.0 cm length.  Less well visualized due to body habitus.  No gross mass or hydronephrosis.  Aorta:  Normal caliber proximally, obscured by bowel gas distally. The  Other:  Significant ascites throughout abdomen.  IMPRESSION: Cirrhotic appearing liver with significant ascites. Thickened gallbladder wall, a nonspecific finding  in the setting of ascites; no gallstones or sonographic Murphy sign identified.   Original Report Authenticated By: Ulyses Southward, M.D.    Dg Chest Port 1 View  01/07/2013  *RADIOLOGY REPORT*  Clinical Data: COPD.  Weakness.  Smoker.  Abdominal distention.  PORTABLE CHEST - 1 VIEW  Comparison: None.  Findings: Lordotic positioning noted.  Pulmonary hyperinflation is suspicious for COPD.  Scarring is noted in the left lung base.  No evidence of pulmonary infiltrate or edema.  No evidence of pleural effusion or pneumothorax.  Old right rib fracture deformities are noted.  Heart size is normal.  No mass or lymphadenopathy identified.  IMPRESSION: COPD and left lower lobe scarring.  No acute findings.   Original Report Authenticated By: Myles Rosenthal, M.D.    Diagnoses that have been ruled out:  None  Diagnoses that are still under consideration:  None  Final diagnoses:  Cirrhosis  Ascites  Urinary tract infection  Hyponatremia  Alcoholism   Plan admission   Devoria Albe, MD, Franz Dell, MD 01/07/13 1728

## 2013-01-07 NOTE — ED Provider Notes (Signed)
History     CSN: [id number]  Arrival date & time 01/07/13  1142   First MD Initiated Contact with Patient 01/07/13 1433      Chief Complaint  Patient presents with  . Constipation  . Abdominal Pain    (Consider location/radiation/quality/duration/timing/severity/associated sxs/prior treatment) HPI Comments: Left side abd pain, intermittent aand mild beginning 4 weeks ago, becoming more severe and constant, now with distention.  Overall weight loss over a few months.  Has h/o hepatitis B with associated jaundice treated years ago with no re-occurrence.  He rerpots poor appetite, early satiety due to abd distention so feels hungry, but is unable to eat.  He report has been drinking 5 beers daily because drinking fludis was all he seemed to be able to toelrate.  No N/V/D.  Reports small, hard stools only.  Also dysuria and low urine output.  No night sweats, cough.  Pt smokes, has family history of colon cancer.  Pt used to be [facility], no PCP now.  Has h/o GERD, ar,thritis, IBS/.  Patient is a 67 y.o. male presenting with constipation and abdominal pain. The history is provided by the patient and the spouse.  Constipation  Associated symptoms include abdominal pain. Pertinent negatives include no fever, no diarrhea, no nausea, no vomiting, no chest pain and no coughing.  Abdominal Pain The primary symptoms of the illness include abdominal pain, fatigue and dysuria. The primary symptoms of the illness do not include fever, shortness of breath, nausea, vomiting or diarrhea.  Additional symptoms associated with the illness include constipation. Symptoms associated with the illness do not include chills or diaphoresis.    Past Medical History  Diagnosis Date  . COPD (chronic obstructive pulmonary disease)   . Acid reflux   . IBS (irritable bowel syndrome)   . Chronic back pain   . Arthritis   . Collapsed lung     Past Surgical History  Procedure Date  . Skin graft   . Skin graft       History reviewed. No pertinent family history.  History  Substance Use Topics  . Smoking status: Current Every Day Smoker -- 0.5 packs/day    Types: Cigarettes  . Smokeless tobacco: Not on file  . Alcohol Use: 21.0 oz/week    35 Cans of beer per week      Review of Systems  Constitutional: Positive for appetite change, fatigue and unexpected weight change. Negative for fever, chills and diaphoresis.  Respiratory: Negative for cough and shortness of breath.   Cardiovascular: Negative for chest pain.  Gastrointestinal: Positive for abdominal pain and constipation. Negative for nausea, vomiting and diarrhea.  Genitourinary: Positive for dysuria. Negative for flank pain.  Skin: Negative for color change.  Neurological: Positive for weakness. Negative for syncope, light-headedness and numbness.  All other systems reviewed and are negative.    Allergies  Review of patient's allergies indicates no known allergies.  Home Medications   Current Outpatient Rx  Name  Route  Sig  Dispense  Refill  . GOODY HEADACHE PO   Oral   Take 1 packet by mouth daily as needed. For pain         . FAMOTIDINE 20 MG PO TABS   Oral   Take 20 mg by mouth 2 (two) times daily.         . ADULT MULTIVITAMIN W/MINERALS CH   Oral   Take 1 tablet by mouth daily.           BP 139/80  Pulse 106  Temp 98.7 F (37.1 C) (Oral)  Resp 22  SpO2 97%  Physical Exam  Nursing note and vitals reviewed. Constitutional: He is oriented to person, place, and time. He appears well-developed. He appears cachectic. He is cooperative.  Non-toxic appearance. He does not have a sickly appearance. He appears ill.  HENT:  Head: Normocephalic and atraumatic.  Eyes: EOM are normal. Scleral icterus is present.  Neck: Normal range of motion. Neck supple.  Cardiovascular: Regular rhythm.   Pulmonary/Chest: Effort normal. No respiratory distress. He has no wheezes. He has no rales.  Abdominal: Soft. He exhibits  shifting dullness, distension and ascites. There is no rebound and no CVA tenderness. No hernia.  Neurological: He is alert and oriented to person, place, and time.  Skin: Skin is warm and dry. No rash noted.    ED Course  Procedures (including critical care time)  Labs Reviewed  URINALYSIS, ROUTINE W REFLEX MICROSCOPIC - Abnormal; Notable for the following:    Color, Urine ORANGE (*)  BIOCHEMICALS MAY BE AFFECTED BY COLOR   APPearance TURBID (*)     Hgb urine dipstick TRACE (*)     Bilirubin Urine LARGE (*)     Ketones, ur 15 (*)     Protein, ur 30 (*)     Urobilinogen, UA 4.0 (*)     Nitrite POSITIVE (*)     Leukocytes, UA LARGE (*)     All other components within normal limits  CBC WITH DIFFERENTIAL - Abnormal; Notable for the following:    RBC 3.86 (*)     HCT 36.7 (*)     MCH 34.2 (*)     Monocytes Relative 22 (*)     Monocytes Absolute 1.2 (*)     All other components within normal limits  COMPREHENSIVE METABOLIC PANEL - Abnormal; Notable for the following:    Sodium 129 (*)     Chloride 85 (*)     BUN 4 (*)     Creatinine, Ser 0.48 (*)     Albumin 3.2 (*)     AST 109 (*)     Alkaline Phosphatase 127 (*)     Total Bilirubin 2.3 (*)     All other components within normal limits  URINE MICROSCOPIC-ADD ON - Abnormal; Notable for the following:    Bacteria, UA MANY (*)     All other components within normal limits  APTT - Abnormal; Notable for the following:    aPTT 38 (*)     All other components within normal limits  PROTIME-INR - Abnormal; Notable for the following:    Prothrombin Time 18.2 (*)     INR 1.56 (*)     All other components within normal limits  LIPASE, BLOOD  URINE CULTURE  ETHANOL   US Abdomen Complete  01/07/2013  *RADIOLOGY REPORT*  Clinical Data:  Abdominal pain  ULTRASOUND ABDOMEN:  Technique:  Sonography of upper abdominal structures was performed.  Comparison:  None  Gallbladder:  Thickened gallbladder wall up to 4 mm thick, a nonspecific  finding in the setting of ascites.  No definite shadowing calculi or sonographic Murphy's sign.  Common bile duct:  Normal caliber 5 mm diameter.  Liver:  Echogenic with nodular margins consistent with cirrhosis. Hepatopetal portal venous flow.  No definite focal hepatic mass identified.  IVC:  Normal appearance  Pancreas:  Normal appearance  Spleen:  Normal size morphology, 7.0 cm length  Right kidney:  10.7 cm length. Normal size without  mass or hydronephrosis. Upper normal cortical echogenicity.  Left kidney:  10.0 cm length.  Less well visualized due to body habitus.  No gross mass or hydronephrosis.  Aorta:  Normal caliber proximally, obscured by bowel gas distally. The  Other:  Significant ascites throughout abdomen.  IMPRESSION: Cirrhotic appearing liver with significant ascites. Thickened gallbladder wall, a nonspecific finding in the setting of ascites; no gallstones or sonographic Murphy sign identified.   Original Report Authenticated By: Ulyses Southward, M.D.    Dg Chest Port 1 View  01/07/2013  *RADIOLOGY REPORT*  Clinical Data: COPD.  Weakness.  Smoker.  Abdominal distention.  PORTABLE CHEST - 1 VIEW  Comparison: None.  Findings: Lordotic positioning noted.  Pulmonary hyperinflation is suspicious for COPD.  Scarring is noted in the left lung base.  No evidence of pulmonary infiltrate or edema.  No evidence of pleural effusion or pneumothorax.  Old right rib fracture deformities are noted.  Heart size is normal.  No mass or lymphadenopathy identified.  IMPRESSION: COPD and left lower lobe scarring.  No acute findings.   Original Report Authenticated By: Myles Rosenthal, M.D.      1. Cirrhosis   2. Ascites   3. Urinary tract infection   4. Hyponatremia   5. Alcoholism     ra sat is 97% and I interpret to be normal.  MDM  Pt with evidence of ascites on exam, also worrisome for chronic infection like hepatitis or cancer given weight loss.  Pt I expect will require admission for therapeutic and  diagnositic paracentesis and further eval for hepatic failure and cirrhosis.  Also will need treatment for UTI.  IV rocepin ordered. Will consult unassigned medicine once U/S result is back.  Pt is signed out to Dr. Lynelle Doctor for follow up on U/S and consult for admission.          Gavin Pound. Lorenia Hoston, MD 01/07/13 1725

## 2013-01-08 ENCOUNTER — Inpatient Hospital Stay (HOSPITAL_COMMUNITY): Payer: Medicare Other

## 2013-01-08 DIAGNOSIS — N3000 Acute cystitis without hematuria: Secondary | ICD-10-CM

## 2013-01-08 LAB — CBC
HCT: 36.1 % — ABNORMAL LOW (ref 39.0–52.0)
Hemoglobin: 12.4 g/dL — ABNORMAL LOW (ref 13.0–17.0)
RDW: 14.1 % (ref 11.5–15.5)
WBC: 3.6 10*3/uL — ABNORMAL LOW (ref 4.0–10.5)

## 2013-01-08 LAB — BASIC METABOLIC PANEL
BUN: 4 mg/dL — ABNORMAL LOW (ref 6–23)
Chloride: 88 mEq/L — ABNORMAL LOW (ref 96–112)
GFR calc Af Amer: >90 mL/min (ref >90)
Glucose, Bld: 113 mg/dL — ABNORMAL HIGH (ref 70–99)
Potassium: 3.1 mEq/L — ABNORMAL LOW (ref 3.5–5.1)

## 2013-01-08 LAB — BODY FLUID CELL COUNT WITH DIFFERENTIAL
Monocyte-Macrophage-Serous Fluid: 94 % — ABNORMAL HIGH (ref 50–90)
Total Nucleated Cell Count, Fluid: 214 cu mm (ref 0–1000)

## 2013-01-08 LAB — PROTEIN, BODY FLUID: Total protein, fluid: 0.9 g/dL

## 2013-01-08 LAB — LACTATE DEHYDROGENASE, PLEURAL OR PERITONEAL FLUID

## 2013-01-08 MED ORDER — POTASSIUM CHLORIDE CRYS ER 20 MEQ PO TBCR
40.0000 meq | EXTENDED_RELEASE_TABLET | Freq: Four times a day (QID) | ORAL | Status: AC
  Administered 2013-01-08 (×2): 40 meq via ORAL
  Filled 2013-01-08 (×2): qty 2

## 2013-01-08 NOTE — Procedures (Signed)
US guided LLQ para  2.8L yellow fluid Sent to lab per MD  Tolerated well stable

## 2013-01-08 NOTE — Progress Notes (Signed)
[facility] PROGRESS NOTE  [name] YQM:[id number] DOB: 06-18-1946 DOA: 01/07/2013 PCP: No primary provider on file.  HPI/Subjective: Denies any abdominal pain, denies any nausea or vomiting. He is very concerned about his lost wallet.   Assessment/Plan:  Abdominal pain -Likely from distention from ascites, patient probably has alcoholic cirrhosis. -Paracentesis to be done to rule out SBP, patient anyway is on Rocephin. -Started on Lasix and spironolactone. -UTI might be also contributing to his abdominal pain.  UTI -Patient started empirically on Rocephin, Urine culture pending. -We'll adjust antibiotics according to culture results.  Alcohol abuse -Patient said he drinks 4-5 beers a day, he used to drink more than that when he was younger. -Counseling about alcohol done, CIWA protocol ordered to be started if he developed withdrawal symptoms.  Hyponatremia -This is likely secondary to cirrhosis, patient is asymptomatic. -As mentioned above Lasix and Aldactone started.  Alcoholic Hepatic cirrhosis -Significant alcohol drinking, patient reported history of hepatitis, check hepatitis panel. -Has hypoalbuminemia, thrombocytopenia, mild coagulopathy, hyponatremia and transaminitis.   Code Status: Full code Family Communication: Plan discussed with the patient Disposition Plan: Remain inpatient   Consultants:  None  Procedures:  None  Antibiotics:  Rocephin started on 01/07/2013   Objective: Filed Vitals:   01/07/13 2100 01/08/13 0300 01/08/13 0500 01/08/13 0900  BP: 128/78 117/68  147/81  Pulse: 107 110  110  Temp: 97.9 F (36.6 C) 97.5 F (36.4 C)  97.6 F (36.4 C)  TempSrc: Oral Oral  Oral  Resp: 18 16  22   Height:      Weight:   55.8 kg (123 lb 0.3 oz)   SpO2: 94% 94%  96%    Intake/Output Summary (Last 24 hours) at 01/08/13 1041 Last data filed at 01/08/13 0853  Gross per 24 hour  Intake 107.33 ml  Output     95 ml  Net  12.33 ml    Filed Weights   01/07/13 1908 01/08/13 0500  Weight: 59.421 kg (131 lb) 55.8 kg (123 lb 0.3 oz)    Exam:  General: Alert and awake, oriented x3, not in any acute distress. HEENT: anicteric sclera, pupils reactive to light and accommodation, EOMI CVS: S1-S2 clear, no murmur rubs or gallops Chest: clear to auscultation bilaterally, no wheezing, rales or rhonchi Abdomen: soft nontender, nondistended, normal bowel sounds, no organomegaly Extremities: no cyanosis, clubbing or edema noted bilaterally Neuro: Cranial nerves II-XII intact, no focal neurological deficits  Data Reviewed: Basic Metabolic Panel:  Lab 01/08/13 5284 01/07/13 1235  NA 131* 129*  K 3.1* 3.5  CL 88* 85*  CO2 33* 28  GLUCOSE 113* 92  BUN 4* 4*  CREATININE 0.45* 0.48*  CALCIUM 8.9 9.3  MG -- --  PHOS -- --   Liver Function Tests:  Lab 01/07/13 1235  AST 109*  ALT 27  ALKPHOS 127*  BILITOT 2.3*  PROT 7.2  ALBUMIN 3.2*    Lab 01/07/13 1235  LIPASE 26  AMYLASE --   No results found for this basename: AMMONIA:5 in the last 168 hours CBC:  Lab 01/08/13 0725 01/07/13 1235  WBC 3.6* 5.4  NEUTROABS -- 3.1  HGB 12.4* 13.2  HCT 36.1* 36.7*  MCV 96.3 95.1  PLT 132* 162   Cardiac Enzymes: No results found for this basename: CKTOTAL:5,CKMB:5,CKMBINDEX:5,TROPONINI:5 in the last 168 hours BNP (last 3 results) No results found for this basename: PROBNP:3 in the last 8760 hours CBG: No results found for this basename: GLUCAP:5 in the last 168 hours  No results found for this or any previous visit (from the past 240 hour(s)).   Studies: US Abdomen Complete  01/07/2013  *RADIOLOGY REPORT*  Clinical Data:  Abdominal pain  ULTRASOUND ABDOMEN:  Technique:  Sonography of upper abdominal structures was performed.  Comparison:  None  Gallbladder:  Thickened gallbladder wall up to 4 mm thick, a nonspecific finding in the setting of ascites.  No definite shadowing calculi or sonographic Murphy's sign.  Common  bile duct:  Normal caliber 5 mm diameter.  Liver:  Echogenic with nodular margins consistent with cirrhosis. Hepatopetal portal venous flow.  No definite focal hepatic mass identified.  IVC:  Normal appearance  Pancreas:  Normal appearance  Spleen:  Normal size morphology, 7.0 cm length  Right kidney:  10.7 cm length. Normal size without mass or hydronephrosis. Upper normal cortical echogenicity.  Left kidney:  10.0 cm length.  Less well visualized due to body habitus.  No gross mass or hydronephrosis.  Aorta:  Normal caliber proximally, obscured by bowel gas distally. The  Other:  Significant ascites throughout abdomen.  IMPRESSION: Cirrhotic appearing liver with significant ascites. Thickened gallbladder wall, a nonspecific finding in the setting of ascites; no gallstones or sonographic Murphy sign identified.   Original Report Authenticated By: Ulyses Southward, M.D.    Dg Chest Port 1 View  01/07/2013  *RADIOLOGY REPORT*  Clinical Data: COPD.  Weakness.  Smoker.  Abdominal distention.  PORTABLE CHEST - 1 VIEW  Comparison: None.  Findings: Lordotic positioning noted.  Pulmonary hyperinflation is suspicious for COPD.  Scarring is noted in the left lung base.  No evidence of pulmonary infiltrate or edema.  No evidence of pleural effusion or pneumothorax.  Old right rib fracture deformities are noted.  Heart size is normal.  No mass or lymphadenopathy identified.  IMPRESSION: COPD and left lower lobe scarring.  No acute findings.   Original Report Authenticated By: Myles Rosenthal, M.D.     Scheduled Meds:   . feeding supplement  237 mL Oral BID BM  . folic acid  1 mg Oral Daily  . furosemide  40 mg Oral Daily  . LORazepam  0-4 mg Intravenous Q6H   Followed by  . LORazepam  0-4 mg Intravenous Q12H  . multivitamin with minerals  1 tablet Oral Daily  . nicotine  14 mg Transdermal Daily  . pantoprazole  40 mg Oral BID  . sodium chloride  3 mL Intravenous Q12H  . spironolactone  50 mg Oral BID  . thiamine  100  mg Oral Daily   Or  . thiamine  100 mg Intravenous Daily   Continuous Infusions:   . sodium chloride 10 mL/hr at 01/07/13 1916    Active Problems:  TOBACCO ABUSE  CHRONIC OBSTRUCTIVE PULMONARY DISEASE, MODERATE  DISC DISEASE, LUMBOSACRAL SPINE  Alcohol abuse  Ascites  UTI (lower urinary tract infection)  Severe protein-calorie malnutrition  Hyponatremia  Cirrhosis  GERD (gastroesophageal reflux disease)    Time spent: 35 minutes    [facility] A  [facility] Pager [phone]. If 8PM-8AM, please contact night-coverage at [email or web address], password [facility] 01/08/2013, 10:41 AM  LOS: 1 day

## 2013-01-09 DIAGNOSIS — F1020 Alcohol dependence, uncomplicated: Secondary | ICD-10-CM

## 2013-01-09 LAB — COMPREHENSIVE METABOLIC PANEL
Alkaline Phosphatase: 121 U/L — ABNORMAL HIGH (ref 39–117)
BUN: 5 mg/dL — ABNORMAL LOW (ref 6–23)
CO2: 35 mEq/L — ABNORMAL HIGH (ref 19–32)
Chloride: 90 mEq/L — ABNORMAL LOW (ref 96–112)
GFR calc Af Amer: >90 mL/min (ref >90)
GFR calc non Af Amer: >90 mL/min (ref >90)
Glucose, Bld: 104 mg/dL — ABNORMAL HIGH (ref 70–99)
Potassium: 3.8 mEq/L (ref 3.5–5.1)
Total Bilirubin: 1.4 mg/dL — ABNORMAL HIGH (ref 0.3–1.2)
Total Protein: 6.3 g/dL (ref 6.0–8.3)

## 2013-01-09 LAB — URINE CULTURE

## 2013-01-09 MED ORDER — MAGNESIUM HYDROXIDE 400 MG/5ML PO SUSP
30.0000 mL | Freq: Once | ORAL | Status: AC
  Administered 2013-01-09: 30 mL via ORAL
  Filled 2013-01-09: qty 30

## 2013-01-09 MED ORDER — POLYETHYLENE GLYCOL 3350 17 G PO PACK
17.0000 g | PACK | Freq: Every day | ORAL | Status: DC
  Administered 2013-01-09 – 2013-01-10 (×2): 17 g via ORAL
  Filled 2013-01-09 (×2): qty 1

## 2013-01-09 NOTE — Progress Notes (Signed)
[facility] PROGRESS NOTE  [name] ZOX:[id number] DOB: 12-30-1945 DOA: 01/07/2013 PCP: No primary provider on file.  HPI/Subjective: Denies any abdominal pain, denies any nausea or vomiting.   Assessment/Plan:  Abdominal pain/ascites -Likely from distention from ascites, patient probably has alcoholic cirrhosis. -Paracentesis showed total WBC of 214, no evidence of SBP -Started on Lasix and spironolactone. Has grade 3 ascites. -UTI might be also contributing to his abdominal pain.  UTI -Patient started empirically on Rocephin, Urine culture pending. -We'll adjust antibiotics according to culture results.  Alcohol abuse -Patient said he drinks 4-5 beers a day, he used to drink more than that when he was younger. -Counseling about alcohol done, CIWA protocol ordered to be started if he developed withdrawal symptoms.  Hyponatremia -This is likely secondary to cirrhosis, patient is asymptomatic. -As mentioned above Lasix and Aldactone started.  Alcoholic Hepatic cirrhosis -Significant alcohol drinking, patient reported history of hepatitis, check hepatitis panel. -Has hypoalbuminemia, thrombocytopenia, mild coagulopathy, hyponatremia and transaminitis.   Code Status: Full code Family Communication: Plan discussed with the patient Disposition Plan: Remain inpatient   Consultants:  None  Procedures:  None  Antibiotics:  Rocephin started on 01/07/2013   Objective: Filed Vitals:   01/08/13 2100 01/09/13 0115 01/09/13 0236 01/09/13 0522  BP: 125/78 128/76 121/74 104/72  Pulse: 115 112 106 110  Temp: 98.2 F (36.8 C)  97.9 F (36.6 C) 97.8 F (36.6 C)  TempSrc:   Oral Oral  Resp: 19  20 20   Height:      Weight:    56.7 kg (125 lb)  SpO2: 97%  94% 97%    Intake/Output Summary (Last 24 hours) at 01/09/13 1310 Last data filed at 01/09/13 1106  Gross per 24 hour  Intake 1306.17 ml  Output   1375 ml  Net -68.83 ml   Filed Weights   01/07/13 1908  01/08/13 0500 01/09/13 0522  Weight: 59.421 kg (131 lb) 55.8 kg (123 lb 0.3 oz) 56.7 kg (125 lb)    Exam:  General: Alert and awake, oriented x3, not in any acute distress. HEENT: anicteric sclera, pupils reactive to light and accommodation, EOMI CVS: S1-S2 clear, no murmur rubs or gallops Chest: clear to auscultation bilaterally, no wheezing, rales or rhonchi Abdomen: soft nontender, nondistended, normal bowel sounds, no organomegaly Extremities: no cyanosis, clubbing or edema noted bilaterally Neuro: Cranial nerves II-XII intact, no focal neurological deficits  Data Reviewed: Basic Metabolic Panel:  Lab 01/09/13 8119 01/08/13 0725 01/07/13 1235  NA 132* 131* 129*  K 3.8 3.1* 3.5  CL 90* 88* 85*  CO2 35* 33* 28  GLUCOSE 104* 113* 92  BUN 5* 4* 4*  CREATININE 0.52 0.45* 0.48*  CALCIUM 8.9 8.9 9.3  MG -- -- --  PHOS -- -- --   Liver Function Tests:  Lab 01/09/13 0645 01/07/13 1235  AST 92* 109*  ALT 26 27  ALKPHOS 121* 127*  BILITOT 1.4* 2.3*  PROT 6.3 7.2  ALBUMIN 2.7* 3.2*    Lab 01/07/13 1235  LIPASE 26  AMYLASE --   No results found for this basename: AMMONIA:5 in the last 168 hours CBC:  Lab 01/08/13 0725 01/07/13 1235  WBC 3.6* 5.4  NEUTROABS -- 3.1  HGB 12.4* 13.2  HCT 36.1* 36.7*  MCV 96.3 95.1  PLT 132* 162   Cardiac Enzymes: No results found for this basename: CKTOTAL:5,CKMB:5,CKMBINDEX:5,TROPONINI:5 in the last 168 hours BNP (last 3 results) No results found for this basename: PROBNP:3 in the last 8760 hours  CBG: No results found for this basename: GLUCAP:5 in the last 168 hours  Recent Results (from the past 240 hour(s))  URINE CULTURE     Status: Normal (Preliminary result)   Collection Time   01/07/13 12:34 PM      Component Value Range Status Comment   Specimen Description URINE, RANDOM   Final    Special Requests NONE   Final    Culture  Setup Time 01/07/2013 22:52   Final    Colony Count >=100,000 COLONIES/ML   Final    Culture  ESCHERICHIA COLI   Final    Report Status PENDING   Incomplete   BODY FLUID CULTURE     Status: Normal (Preliminary result)   Collection Time   01/08/13 11:37 AM      Component Value Range Status Comment   Specimen Description ASCITIC ABDOMEN FLUID   Final    Special Requests FLUID   Final    Gram Stain     Final    Value: RARE WBC PRESENT, PREDOMINANTLY MONONUCLEAR     NO ORGANISMS SEEN   Culture PENDING   Incomplete    Report Status PENDING   Incomplete      Studies: US Abdomen Complete  01/07/2013  *RADIOLOGY REPORT*  Clinical Data:  Abdominal pain  ULTRASOUND ABDOMEN:  Technique:  Sonography of upper abdominal structures was performed.  Comparison:  None  Gallbladder:  Thickened gallbladder wall up to 4 mm thick, a nonspecific finding in the setting of ascites.  No definite shadowing calculi or sonographic Murphy's sign.  Common bile duct:  Normal caliber 5 mm diameter.  Liver:  Echogenic with nodular margins consistent with cirrhosis. Hepatopetal portal venous flow.  No definite focal hepatic mass identified.  IVC:  Normal appearance  Pancreas:  Normal appearance  Spleen:  Normal size morphology, 7.0 cm length  Right kidney:  10.7 cm length. Normal size without mass or hydronephrosis. Upper normal cortical echogenicity.  Left kidney:  10.0 cm length.  Less well visualized due to body habitus.  No gross mass or hydronephrosis.  Aorta:  Normal caliber proximally, obscured by bowel gas distally. The  Other:  Significant ascites throughout abdomen.  IMPRESSION: Cirrhotic appearing liver with significant ascites. Thickened gallbladder wall, a nonspecific finding in the setting of ascites; no gallstones or sonographic Murphy sign identified.   Original Report Authenticated By: Ulyses Southward, M.D.    US Paracentesis  01/08/2013  *RADIOLOGY REPORT*  Clinical Data: Abdominal ascites  ULTRASOUND GUIDED PARACENTESIS  Comparison:  None  An ultrasound guided paracentesis was thoroughly discussed with  the patient and questions answered.  The benefits, risks, alternatives and complications were also discussed.  The patient understands and wishes to proceed with the procedure.  Written consent was obtained.  Ultrasound was performed to localize and mark an adequate pocket of fluid in the left lower quadrant of the abdomen.  The area was then prepped and draped in the normal sterile fashion.  1% Lidocaine was used for local anesthesia.  Under ultrasound guidance a 19 gauge Yueh catheter was introduced.  Paracentesis was performed.  The catheter was removed and a dressing applied.  Complications:  None  Findings:  A total of approximately 2.8 liters of yellow fluid was removed.  A fluid sample was sent for laboratory analysis.  IMPRESSION: Successful ultrasound guided paracentesis yielding 2.8 liters of ascites.  Read by: Ralene Muskrat, P.A.-C   Original Report Authenticated By: Tacey Ruiz, MD    Dg  Chest Port 1 View  01/07/2013  *RADIOLOGY REPORT*  Clinical Data: COPD.  Weakness.  Smoker.  Abdominal distention.  PORTABLE CHEST - 1 VIEW  Comparison: None.  Findings: Lordotic positioning noted.  Pulmonary hyperinflation is suspicious for COPD.  Scarring is noted in the left lung base.  No evidence of pulmonary infiltrate or edema.  No evidence of pleural effusion or pneumothorax.  Old right rib fracture deformities are noted.  Heart size is normal.  No mass or lymphadenopathy identified.  IMPRESSION: COPD and left lower lobe scarring.  No acute findings.   Original Report Authenticated By: Myles Rosenthal, M.D.     Scheduled Meds:    . feeding supplement  237 mL Oral BID BM  . folic acid  1 mg Oral Daily  . furosemide  40 mg Oral Daily  . LORazepam  0-4 mg Intravenous Q6H   Followed by  . LORazepam  0-4 mg Intravenous Q12H  . multivitamin with minerals  1 tablet Oral Daily  . nicotine  14 mg Transdermal Daily  . pantoprazole  40 mg Oral BID  . sodium chloride  3 mL Intravenous Q12H  . spironolactone   50 mg Oral BID  . thiamine  100 mg Oral Daily   Continuous Infusions:    . sodium chloride 10 mL/hr at 01/07/13 1916    Active Problems:  TOBACCO ABUSE  CHRONIC OBSTRUCTIVE PULMONARY DISEASE, MODERATE  DISC DISEASE, LUMBOSACRAL SPINE  Alcohol abuse  Ascites  UTI (lower urinary tract infection)  Severe protein-calorie malnutrition  Hyponatremia  Cirrhosis  GERD (gastroesophageal reflux disease)    Time spent: 35 minutes    [facility] A  [facility] Pager [phone]. If 8PM-8AM, please contact night-coverage at [email or web address], password [facility] 01/09/2013, 1:10 PM  LOS: 2 days

## 2013-01-10 LAB — BASIC METABOLIC PANEL
Calcium: 9.3 mg/dL (ref 8.4–10.5)
Creatinine, Ser: 0.6 mg/dL (ref 0.50–1.35)
GFR calc Af Amer: >90 mL/min (ref >90)
GFR calc non Af Amer: >90 mL/min (ref >90)
Sodium: 129 mEq/L — ABNORMAL LOW (ref 135–145)

## 2013-01-10 MED ORDER — FUROSEMIDE 40 MG PO TABS: 40.0000 mg | ORAL_TABLET | Freq: Every day | ORAL | Status: DC

## 2013-01-10 MED ORDER — CIPROFLOXACIN HCL 500 MG PO TABS: 500.0000 mg | ORAL_TABLET | Freq: Two times a day (BID) | ORAL | Status: DC

## 2013-01-10 MED ORDER — FLEET ENEMA 7-19 GM/118ML RE ENEM
1.0000 | ENEMA | Freq: Once | RECTAL | Status: AC
  Administered 2013-01-10: 1 via RECTAL
  Filled 2013-01-10: qty 1

## 2013-01-10 MED ORDER — SPIRONOLACTONE 100 MG PO TABS: 100.0000 mg | ORAL_TABLET | Freq: Every day | ORAL | Status: DC

## 2013-01-10 NOTE — Progress Notes (Signed)
Pt up and able to dress self.  IV d/c right forearm without problem.  Discharge instructions discussed with patient and wife, copy given with Rx (2).  Pt understands he states he will get a primary care physician lives in [place] area.  Bruises to arms, but skin intact.  Continued congested cough.  Alert, oriented escorted via wheelchair to meet wife for discharge home.  Bonney Leitz RN

## 2013-01-10 NOTE — Discharge Summary (Signed)
Physician Discharge Summary  [name] HYQ:[id number] DOB: 08/21/1946 DOA: 01/07/2013  PCP: No primary provider on file.  Admit date: 01/07/2013 Discharge date: 01/10/2013  Time spent: 40 minutes  Recommendations for Outpatient Follow-up:   Followup with primary care physician, needs referral to gastroenterology for his cirrhosis.  Discussed with him and his sister to followup with primary care physician (patient has Medicare).  Followup on renal function as patient started recently on Lasix and Aldactone.  Discharge Diagnoses:  Active Problems:  TOBACCO ABUSE  CHRONIC OBSTRUCTIVE PULMONARY DISEASE, MODERATE  DISC DISEASE, LUMBOSACRAL SPINE  Alcohol abuse  Ascites  UTI (lower urinary tract infection)  Severe protein-calorie malnutrition  Hyponatremia  Cirrhosis  GERD (gastroesophageal reflux disease)   Discharge Condition: Stable  Diet recommendation: Regular diet  Filed Weights   01/08/13 0500 01/09/13 0522 01/10/13 0518  Weight: 55.8 kg (123 lb 0.3 oz) 56.7 kg (125 lb) 56 kg (123 lb 7.3 oz)    History of present illness:  [name] is a 67 y.o. male with past medical history significant COPD, tobacco abuse, chronic back pain, ulcer arthritis, alcohol abuse and GERD; came to the hospital secondary to abdominal pain. Patient reports left side and epigastric abdominal pain intermittently [place] for approximately 3-4 weeks prior to admission. Pain has progressively worsened and he has also noticed increase distention on his daily. Patient associated weight loss, poor appetite, early satiety due to his abdominal distention, and increase weakness. He reports that he continued to be drinking approximately 3-5 beers daily. Able to tolerate fluids only at this moment. Patient denies nausea, vomiting, diarrhea, shortness of breath, fever, chills, night sweats, cough or any other acute complaints.  Hospital Course:   1. Abdominal pain: This is likely secondary to abdominal  distention from his grade 3 ascites. His ascites probably secondary to alcohol cirrhosis. Thoracentesis was done under ultrasound guidance and showed WBCs of only 2014, there is no evidence of SBP. The pain was much better after the fluids was removed and patient felt less tension, patient also started on Lasix and a spinal abdomen to decrease the ascites. He has UTI which might also contribute to his abdominal pain. Patient does have serum albumin of 2.7 and ascitic fluid albumin as 0.5 with SAAG of 2.2 consistent with ascites secondary to portal hypertension.  2. UTI: At the time of admission to the hospital patient urinalysis was consistent with UTI, started empirically on Rocephin. At the urine culture comes back, its pansensitive Escherichia coli. Patient started ciprofloxacin to complete 10 days of antibiotics.  3. Alcohol abuse: Patient said he drinks 4-5 beers a day, used to drink more when he was younger. Patient counseled extensively about alcohol use, CIWA protocol ordered, but patient did not have any symptoms or signs suggesting withdrawal, Ativan was not given.  4. Hyponatremia: This is likely secondary to his cirrhosis, patient is asymptomatic, as mentioned above patient's son and Lasix and Aldactone. Needs followup as outpatient.  5. Hepatic cirrhosis: Has significant alcohol consumption, patient reported history of hepatitis A, he has hypoalbuminemia, thrombocytopenia, mild coagulopathy, hyponatremia and transaminitis. Patient needs to followup with gastroenterology as outpatient, no evidence of encephalopathy.  Procedures:  Ultrasound guided paracentesis with him Will 2.8 yellow fluids done by interventional radiology on 01/08/2013  Consultations:  None  Discharge Exam: Filed Vitals:   01/09/13 1500 01/09/13 2031 01/09/13 2112 01/10/13 0518  BP: 120/81 114/74 109/74 107/72  Pulse: 103 110 112 98  Temp: 97.9 F (36.6 C)  97.3 F (  36.3 C) 98.2 F (36.8 C)  TempSrc: Oral   Oral Oral  Resp: 18  20 20   Height:      Weight:    56 kg (123 lb 7.3 oz)  SpO2: 96%  94% 93%   General: Alert and awake, oriented x3, not in any acute distress. HEENT: anicteric sclera, pupils reactive to light and accommodation, EOMI CVS: S1-S2 clear, no murmur rubs or gallops Chest: clear to auscultation bilaterally, no wheezing, rales or rhonchi Abdomen: soft nontender, nondistended, normal bowel sounds, no organomegaly Extremities: no cyanosis, clubbing or edema noted bilaterally Neuro: Cranial nerves II-XII intact, no focal neurological deficits   Discharge Instructions  Discharge Orders    Future Orders Please Complete By Expires   Increase activity slowly          Medication List     As of 01/10/2013 11:01 AM    STOP taking these medications         GOODY HEADACHE PO      TAKE these medications         ciprofloxacin 500 MG tablet   Commonly known as: CIPRO   Take 1 tablet (500 mg total) by mouth 2 (two) times daily.      famotidine 20 MG tablet   Commonly known as: PEPCID   Take 20 mg by mouth 2 (two) times daily.      furosemide 40 MG tablet   Commonly known as: LASIX   Take 1 tablet (40 mg total) by mouth daily.      multivitamin with minerals Tabs   Take 1 tablet by mouth daily.      spironolactone 100 MG tablet   Commonly known as: ALDACTONE   Take 1 tablet (100 mg total) by mouth daily.           Follow-up Information    Follow up with your Primary Care Physician. In 1 week.          The results of significant diagnostics from this hospitalization (including imaging, microbiology, ancillary and laboratory) are listed below for reference.    Significant Diagnostic Studies: US Abdomen Complete  01/07/2013  *RADIOLOGY REPORT*  Clinical Data:  Abdominal pain  ULTRASOUND ABDOMEN:  Technique:  Sonography of upper abdominal structures was performed.  Comparison:  None  Gallbladder:  Thickened gallbladder wall up to 4 mm thick, a nonspecific  finding in the setting of ascites.  No definite shadowing calculi or sonographic Murphy's sign.  Common bile duct:  Normal caliber 5 mm diameter.  Liver:  Echogenic with nodular margins consistent with cirrhosis. Hepatopetal portal venous flow.  No definite focal hepatic mass identified.  IVC:  Normal appearance  Pancreas:  Normal appearance  Spleen:  Normal size morphology, 7.0 cm length  Right kidney:  10.7 cm length. Normal size without mass or hydronephrosis. Upper normal cortical echogenicity.  Left kidney:  10.0 cm length.  Less well visualized due to body habitus.  No gross mass or hydronephrosis.  Aorta:  Normal caliber proximally, obscured by bowel gas distally. The  Other:  Significant ascites throughout abdomen.  IMPRESSION: Cirrhotic appearing liver with significant ascites. Thickened gallbladder wall, a nonspecific finding in the setting of ascites; no gallstones or sonographic Murphy sign identified.   Original Report Authenticated By: Ulyses Southward, M.D.    US Paracentesis  01/08/2013  *RADIOLOGY REPORT*  Clinical Data: Abdominal ascites  ULTRASOUND GUIDED PARACENTESIS  Comparison:  None  An ultrasound guided paracentesis was thoroughly discussed with the patient and questions  answered.  The benefits, risks, alternatives and complications were also discussed.  The patient understands and wishes to proceed with the procedure.  Written consent was obtained.  Ultrasound was performed to localize and mark an adequate pocket of fluid in the left lower quadrant of the abdomen.  The area was then prepped and draped in the normal sterile fashion.  1% Lidocaine was used for local anesthesia.  Under ultrasound guidance a 19 gauge Yueh catheter was introduced.  Paracentesis was performed.  The catheter was removed and a dressing applied.  Complications:  None  Findings:  A total of approximately 2.8 liters of yellow fluid was removed.  A fluid sample was sent for laboratory analysis.  IMPRESSION: Successful  ultrasound guided paracentesis yielding 2.8 liters of ascites.  Read by: Ralene Muskrat, P.A.-C   Original Report Authenticated By: Tacey Ruiz, MD    Dg Chest Port 1 View  01/07/2013  *RADIOLOGY REPORT*  Clinical Data: COPD.  Weakness.  Smoker.  Abdominal distention.  PORTABLE CHEST - 1 VIEW  Comparison: None.  Findings: Lordotic positioning noted.  Pulmonary hyperinflation is suspicious for COPD.  Scarring is noted in the left lung base.  No evidence of pulmonary infiltrate or edema.  No evidence of pleural effusion or pneumothorax.  Old right rib fracture deformities are noted.  Heart size is normal.  No mass or lymphadenopathy identified.  IMPRESSION: COPD and left lower lobe scarring.  No acute findings.   Original Report Authenticated By: Myles Rosenthal, M.D.     Microbiology: Recent Results (from the past 240 hour(s))  URINE CULTURE     Status: Normal   Collection Time   01/07/13 12:34 PM      Component Value Range Status Comment   Specimen Description URINE, RANDOM   Final    Special Requests NONE   Final    Culture  Setup Time 01/07/2013 22:52   Final    Colony Count >=100,000 COLONIES/ML   Final    Culture ESCHERICHIA COLI   Final    Report Status 01/09/2013 FINAL   Final    Organism ID, Bacteria ESCHERICHIA COLI   Final   BODY FLUID CULTURE     Status: Normal (Preliminary result)   Collection Time   01/08/13 11:37 AM      Component Value Range Status Comment   Specimen Description ASCITIC ABDOMEN FLUID   Final    Special Requests FLUID   Final    Gram Stain     Final    Value: RARE WBC PRESENT, PREDOMINANTLY MONONUCLEAR     NO ORGANISMS SEEN   Culture NO GROWTH 1 DAY   Final    Report Status PENDING   Incomplete      Labs: Basic Metabolic Panel:  Lab 01/10/13 1610 01/09/13 0645 01/08/13 0725 01/07/13 1235  NA 129* 132* 131* 129*  K 4.3 3.8 3.1* 3.5  CL 84* 90* 88* 85*  CO2 39* 35* 33* 28  GLUCOSE 94 104* 113* 92  BUN 6 5* 4* 4*  CREATININE 0.60 0.52 0.45* 0.48*    CALCIUM 9.3 8.9 8.9 9.3  MG -- -- -- --  PHOS -- -- -- --   Liver Function Tests:  Lab 01/09/13 0645 01/07/13 1235  AST 92* 109*  ALT 26 27  ALKPHOS 121* 127*  BILITOT 1.4* 2.3*  PROT 6.3 7.2  ALBUMIN 2.7* 3.2*    Lab 01/07/13 1235  LIPASE 26  AMYLASE --   No results found for  this basename: AMMONIA:5 in the last 168 hours CBC:  Lab 01/08/13 0725 01/07/13 1235  WBC 3.6* 5.4  NEUTROABS -- 3.1  HGB 12.4* 13.2  HCT 36.1* 36.7*  MCV 96.3 95.1  PLT 132* 162   Cardiac Enzymes: No results found for this basename: CKTOTAL:5,CKMB:5,CKMBINDEX:5,TROPONINI:5 in the last 168 hours BNP: BNP (last 3 results) No results found for this basename: PROBNP:3 in the last 8760 hours CBG: No results found for this basename: GLUCAP:5 in the last 168 hours     Signed:  Avis Mcmahill A  [facility] 01/10/2013, 11:01 AM

## 2013-01-10 NOTE — Progress Notes (Signed)
MD, Patient's wife requesting a phone call after am rounds.  Please call Captain Blucher [phone].

## 2013-01-11 LAB — BODY FLUID CULTURE: Culture: NO GROWTH

## 2013-01-11 LAB — PATHOLOGIST SMEAR REVIEW

## 2013-01-12 NOTE — Care Management Note (Signed)
    Page 1 of 1   01/12/2013     2:42:28 PM   CARE MANAGEMENT NOTE 01/12/2013  Patient:  [name], [name]   Account Number:  [id number]  Date Initiated:  01/12/2013  Documentation initiated by:  Letha Cape  Subjective/Objective Assessment:   dx ascities ,uti  admit-lives with parent.     Action/Plan:   ciwa  1/24 parancentesis   Anticipated DC Date:  01/10/2013   Anticipated DC Plan:  HOME/SELF CARE      DC Planning Services  CM consult      Choice offered to / List presented to:             Status of service:  Completed, signed off Medicare Important Message given?   (If response is "NO", the following Medicare IM given date fields will be blank) Date Medicare IM given:   Date Additional Medicare IM given:    Discharge Disposition:  HOME/SELF CARE  Per UR Regulation:  Reviewed for med. necessity/level of care/duration of stay  If discussed at Long Length of Stay Meetings, dates discussed:    Comments:

## 2014-07-03 ENCOUNTER — Inpatient Hospital Stay (HOSPITAL_COMMUNITY)
Admission: EM | Admit: 2014-07-03 | Discharge: 2014-07-10 | DRG: 368 | Disposition: A | Discharge status: Home Health | Payer: Medicare Other | Attending: Internal Medicine | Admitting: Internal Medicine

## 2014-07-03 DIAGNOSIS — S2249XA Multiple fractures of ribs, unspecified side, initial encounter for closed fracture: Secondary | ICD-10-CM

## 2014-07-03 DIAGNOSIS — J4489 Other specified chronic obstructive pulmonary disease: Secondary | ICD-10-CM | POA: Diagnosis present

## 2014-07-03 DIAGNOSIS — F1010 Alcohol abuse, uncomplicated: Secondary | ICD-10-CM | POA: Diagnosis present

## 2014-07-03 DIAGNOSIS — K7469 Other cirrhosis of liver: Secondary | ICD-10-CM

## 2014-07-03 DIAGNOSIS — D709 Neutropenia, unspecified: Secondary | ICD-10-CM | POA: Diagnosis present

## 2014-07-03 DIAGNOSIS — K922 Gastrointestinal hemorrhage, unspecified: Secondary | ICD-10-CM

## 2014-07-03 DIAGNOSIS — K560 Paralytic ileus: Secondary | ICD-10-CM | POA: Diagnosis present

## 2014-07-03 DIAGNOSIS — D759 Disease of blood and blood-forming organs, unspecified: Secondary | ICD-10-CM | POA: Diagnosis present

## 2014-07-03 DIAGNOSIS — E872 Acidosis, unspecified: Secondary | ICD-10-CM | POA: Diagnosis present

## 2014-07-03 DIAGNOSIS — K219 Gastro-esophageal reflux disease without esophagitis: Secondary | ICD-10-CM | POA: Diagnosis present

## 2014-07-03 DIAGNOSIS — R188 Other ascites: Secondary | ICD-10-CM | POA: Diagnosis present

## 2014-07-03 DIAGNOSIS — E440 Moderate protein-calorie malnutrition: Secondary | ICD-10-CM

## 2014-07-03 DIAGNOSIS — K920 Hematemesis: Secondary | ICD-10-CM | POA: Diagnosis not present

## 2014-07-03 DIAGNOSIS — D638 Anemia in other chronic diseases classified elsewhere: Secondary | ICD-10-CM | POA: Diagnosis present

## 2014-07-03 DIAGNOSIS — J9600 Acute respiratory failure, unspecified whether with hypoxia or hypercapnia: Secondary | ICD-10-CM | POA: Diagnosis present

## 2014-07-03 DIAGNOSIS — K7031 Alcoholic cirrhosis of liver with ascites: Secondary | ICD-10-CM

## 2014-07-03 DIAGNOSIS — K319 Disease of stomach and duodenum, unspecified: Secondary | ICD-10-CM | POA: Diagnosis present

## 2014-07-03 DIAGNOSIS — J449 Chronic obstructive pulmonary disease, unspecified: Secondary | ICD-10-CM | POA: Diagnosis present

## 2014-07-03 DIAGNOSIS — R142 Eructation: Secondary | ICD-10-CM

## 2014-07-03 DIAGNOSIS — M5137 Other intervertebral disc degeneration, lumbosacral region: Secondary | ICD-10-CM

## 2014-07-03 DIAGNOSIS — F1020 Alcohol dependence, uncomplicated: Secondary | ICD-10-CM | POA: Diagnosis present

## 2014-07-03 DIAGNOSIS — F17200 Nicotine dependence, unspecified, uncomplicated: Secondary | ICD-10-CM | POA: Diagnosis present

## 2014-07-03 DIAGNOSIS — K7030 Alcoholic cirrhosis of liver without ascites: Secondary | ICD-10-CM | POA: Diagnosis present

## 2014-07-03 DIAGNOSIS — IMO0002 Reserved for concepts with insufficient information to code with codable children: Secondary | ICD-10-CM

## 2014-07-03 DIAGNOSIS — G8929 Other chronic pain: Secondary | ICD-10-CM | POA: Diagnosis present

## 2014-07-03 DIAGNOSIS — R7309 Other abnormal glucose: Secondary | ICD-10-CM | POA: Diagnosis present

## 2014-07-03 DIAGNOSIS — K7460 Unspecified cirrhosis of liver: Secondary | ICD-10-CM | POA: Diagnosis present

## 2014-07-03 DIAGNOSIS — I10 Essential (primary) hypertension: Secondary | ICD-10-CM | POA: Diagnosis present

## 2014-07-03 DIAGNOSIS — R141 Gas pain: Secondary | ICD-10-CM | POA: Diagnosis present

## 2014-07-03 DIAGNOSIS — K567 Ileus, unspecified: Secondary | ICD-10-CM

## 2014-07-03 DIAGNOSIS — E876 Hypokalemia: Secondary | ICD-10-CM | POA: Diagnosis present

## 2014-07-03 DIAGNOSIS — I8510 Secondary esophageal varices without bleeding: Secondary | ICD-10-CM | POA: Diagnosis present

## 2014-07-03 DIAGNOSIS — E871 Hypo-osmolality and hyponatremia: Secondary | ICD-10-CM | POA: Diagnosis present

## 2014-07-03 DIAGNOSIS — K766 Portal hypertension: Secondary | ICD-10-CM | POA: Diagnosis present

## 2014-07-03 DIAGNOSIS — K226 Gastro-esophageal laceration-hemorrhage syndrome: Principal | ICD-10-CM | POA: Diagnosis present

## 2014-07-03 DIAGNOSIS — R143 Flatulence: Secondary | ICD-10-CM

## 2014-07-03 DIAGNOSIS — D61818 Other pancytopenia: Secondary | ICD-10-CM

## 2014-07-03 DIAGNOSIS — D62 Acute posthemorrhagic anemia: Secondary | ICD-10-CM | POA: Diagnosis present

## 2014-07-03 DIAGNOSIS — E43 Unspecified severe protein-calorie malnutrition: Secondary | ICD-10-CM

## 2014-07-03 DIAGNOSIS — R578 Other shock: Secondary | ICD-10-CM

## 2014-07-03 NOTE — ED Notes (Signed)
Per EMS: Pt from home, lives with his son. Pt vomiting blood onset tonight. Distended abdomen. Heavy alcohol drinker, drinks every day. A&O x 4. Ambulatory. Generalized weakness. Denies any pain to abdomen, "just feels sick."

## 2014-07-03 NOTE — ED Notes (Signed)
Bed: [id number] Expected date:  Expected time:  Means of arrival:  Comments: EMS/vomiting blood,abdominal distention

## 2014-07-04 ENCOUNTER — Emergency Department (HOSPITAL_COMMUNITY): Payer: Medicare Other

## 2014-07-04 ENCOUNTER — Inpatient Hospital Stay (HOSPITAL_COMMUNITY): Payer: Medicare Other

## 2014-07-04 ENCOUNTER — Encounter (HOSPITAL_COMMUNITY): Payer: Self-pay | Admitting: Emergency Medicine

## 2014-07-04 ENCOUNTER — Encounter (HOSPITAL_COMMUNITY)
Admission: EM | Disposition: A | Discharge status: Home Health | Payer: Self-pay | Source: Home / Self Care | Attending: Emergency Medicine

## 2014-07-04 DIAGNOSIS — D61818 Other pancytopenia: Secondary | ICD-10-CM | POA: Diagnosis present

## 2014-07-04 DIAGNOSIS — K226 Gastro-esophageal laceration-hemorrhage syndrome: Secondary | ICD-10-CM | POA: Diagnosis present

## 2014-07-04 DIAGNOSIS — D638 Anemia in other chronic diseases classified elsewhere: Secondary | ICD-10-CM | POA: Diagnosis present

## 2014-07-04 DIAGNOSIS — K922 Gastrointestinal hemorrhage, unspecified: Secondary | ICD-10-CM

## 2014-07-04 DIAGNOSIS — D759 Disease of blood and blood-forming organs, unspecified: Secondary | ICD-10-CM | POA: Diagnosis present

## 2014-07-04 DIAGNOSIS — K920 Hematemesis: Secondary | ICD-10-CM | POA: Diagnosis present

## 2014-07-04 DIAGNOSIS — E872 Acidosis, unspecified: Secondary | ICD-10-CM | POA: Diagnosis present

## 2014-07-04 DIAGNOSIS — R141 Gas pain: Secondary | ICD-10-CM | POA: Diagnosis present

## 2014-07-04 DIAGNOSIS — E876 Hypokalemia: Secondary | ICD-10-CM | POA: Diagnosis present

## 2014-07-04 DIAGNOSIS — K7030 Alcoholic cirrhosis of liver without ascites: Secondary | ICD-10-CM | POA: Diagnosis present

## 2014-07-04 DIAGNOSIS — J9600 Acute respiratory failure, unspecified whether with hypoxia or hypercapnia: Secondary | ICD-10-CM | POA: Diagnosis present

## 2014-07-04 DIAGNOSIS — D62 Acute posthemorrhagic anemia: Secondary | ICD-10-CM | POA: Diagnosis present

## 2014-07-04 DIAGNOSIS — I8510 Secondary esophageal varices without bleeding: Secondary | ICD-10-CM | POA: Diagnosis present

## 2014-07-04 DIAGNOSIS — I10 Essential (primary) hypertension: Secondary | ICD-10-CM | POA: Diagnosis present

## 2014-07-04 DIAGNOSIS — E440 Moderate protein-calorie malnutrition: Secondary | ICD-10-CM | POA: Diagnosis present

## 2014-07-04 DIAGNOSIS — K766 Portal hypertension: Secondary | ICD-10-CM | POA: Diagnosis present

## 2014-07-04 DIAGNOSIS — G8929 Other chronic pain: Secondary | ICD-10-CM | POA: Diagnosis present

## 2014-07-04 DIAGNOSIS — D709 Neutropenia, unspecified: Secondary | ICD-10-CM | POA: Diagnosis present

## 2014-07-04 DIAGNOSIS — E871 Hypo-osmolality and hyponatremia: Secondary | ICD-10-CM | POA: Diagnosis present

## 2014-07-04 DIAGNOSIS — K219 Gastro-esophageal reflux disease without esophagitis: Secondary | ICD-10-CM | POA: Diagnosis present

## 2014-07-04 DIAGNOSIS — K319 Disease of stomach and duodenum, unspecified: Secondary | ICD-10-CM | POA: Diagnosis present

## 2014-07-04 DIAGNOSIS — E43 Unspecified severe protein-calorie malnutrition: Secondary | ICD-10-CM | POA: Diagnosis present

## 2014-07-04 DIAGNOSIS — IMO0002 Reserved for concepts with insufficient information to code with codable children: Secondary | ICD-10-CM | POA: Diagnosis not present

## 2014-07-04 DIAGNOSIS — R578 Other shock: Secondary | ICD-10-CM | POA: Diagnosis present

## 2014-07-04 DIAGNOSIS — J449 Chronic obstructive pulmonary disease, unspecified: Secondary | ICD-10-CM | POA: Diagnosis present

## 2014-07-04 DIAGNOSIS — R7309 Other abnormal glucose: Secondary | ICD-10-CM | POA: Diagnosis present

## 2014-07-04 DIAGNOSIS — K560 Paralytic ileus: Secondary | ICD-10-CM | POA: Diagnosis present

## 2014-07-04 DIAGNOSIS — F1020 Alcohol dependence, uncomplicated: Secondary | ICD-10-CM | POA: Diagnosis present

## 2014-07-04 DIAGNOSIS — F17200 Nicotine dependence, unspecified, uncomplicated: Secondary | ICD-10-CM | POA: Diagnosis present

## 2014-07-04 DIAGNOSIS — R143 Flatulence: Secondary | ICD-10-CM | POA: Diagnosis present

## 2014-07-04 DIAGNOSIS — R188 Other ascites: Secondary | ICD-10-CM | POA: Diagnosis present

## 2014-07-04 HISTORY — PX: ESOPHAGOGASTRODUODENOSCOPY: SHX5428

## 2014-07-04 LAB — BLOOD GAS, ARTERIAL
Acid-base deficit: 7.5 mmol/L — ABNORMAL HIGH (ref 0.0–2.0)
Acid-base deficit: 1.2 mmol/L (ref 0.0–2.0)
BICARBONATE: 22.5 meq/L (ref 20.0–24.0)
Bicarbonate: 18.2 mEq/L — ABNORMAL LOW (ref 20.0–24.0)
DRAWN BY: 308601
Drawn by: 281201
FIO2: 0.6 %
FIO2: 0.3 %
LHR: 16 {breaths}/min
MECHVT: 550 mL
MECHVT: 480 mL
O2 Saturation: 99 %
O2 Saturation: 94.5 %
PCO2 ART: 40.7 mmHg (ref 35.0–45.0)
PEEP/CPAP: 5 cmH2O
PEEP: 5 cmH2O
PH ART: 7.423 (ref 7.350–7.450)
PO2 ART: 63.8 mmHg — AB (ref 80.0–100.0)
Patient temperature: 98.6
Patient temperature: 98.6
RATE: 20 resp/min
TCO2: 17.9 mmol/L (ref 0–100)
TCO2: 21.7 mmol/L (ref 0–100)
pCO2 arterial: 35.1 mmHg (ref 35.0–45.0)
pH, Arterial: 7.273 — ABNORMAL LOW (ref 7.350–7.450)
pO2, Arterial: 203 mmHg — ABNORMAL HIGH (ref 80.0–100.0)

## 2014-07-04 LAB — CBC
HCT: 24 % — ABNORMAL LOW (ref 39.0–52.0)
HCT: 21.8 % — ABNORMAL LOW (ref 39.0–52.0)
HCT: 21 % — ABNORMAL LOW (ref 39.0–52.0)
HCT: 25.2 % — ABNORMAL LOW (ref 39.0–52.0)
HCT: 25.7 % — ABNORMAL LOW (ref 39.0–52.0)
HEMATOCRIT: 25.7 % — AB (ref 39.0–52.0)
HEMOGLOBIN: 7.6 g/dL — AB (ref 13.0–17.0)
HEMOGLOBIN: 8.5 g/dL — AB (ref 13.0–17.0)
Hemoglobin: 7 g/dL — ABNORMAL LOW (ref 13.0–17.0)
Hemoglobin: 6.7 g/dL — CL (ref 13.0–17.0)
Hemoglobin: 8.1 g/dL — ABNORMAL LOW (ref 13.0–17.0)
Hemoglobin: 8.3 g/dL — ABNORMAL LOW (ref 13.0–17.0)
MCH: 25.8 pg — ABNORMAL LOW (ref 26.0–34.0)
MCH: 26.6 pg (ref 26.0–34.0)
MCH: 26.8 pg (ref 26.0–34.0)
MCH: 26.3 pg (ref 26.0–34.0)
MCH: 26.6 pg (ref 26.0–34.0)
MCH: 26.7 pg (ref 26.0–34.0)
MCHC: 31.7 g/dL (ref 30.0–36.0)
MCHC: 31.9 g/dL (ref 30.0–36.0)
MCHC: 32.1 g/dL (ref 30.0–36.0)
MCHC: 32.1 g/dL (ref 30.0–36.0)
MCHC: 32.3 g/dL (ref 30.0–36.0)
MCHC: 33.1 g/dL (ref 30.0–36.0)
MCV: 81.4 fL (ref 78.0–100.0)
MCV: 82.9 fL (ref 78.0–100.0)
MCV: 82.4 fL (ref 78.0–100.0)
MCV: 82.9 fL (ref 78.0–100.0)
MCV: 82.6 fL (ref 78.0–100.0)
MCV: 81.1 fL (ref 78.0–100.0)
PLATELETS: 107 10*3/uL — AB (ref 150–400)
PLATELETS: 53 10*3/uL — AB (ref 150–400)
PLATELETS: 54 10*3/uL — AB (ref 150–400)
PLATELETS: 56 10*3/uL — AB (ref 150–400)
PLATELETS: 67 10*3/uL — AB (ref 150–400)
Platelets: 61 10*3/uL — ABNORMAL LOW (ref 150–400)
RBC: 2.95 MIL/uL — ABNORMAL LOW (ref 4.22–5.81)
RBC: 3.04 MIL/uL — AB (ref 4.22–5.81)
RBC: 3.17 MIL/uL — AB (ref 4.22–5.81)
RBC: 2.63 MIL/uL — ABNORMAL LOW (ref 4.22–5.81)
RBC: 2.55 MIL/uL — ABNORMAL LOW (ref 4.22–5.81)
RBC: 3.11 MIL/uL — ABNORMAL LOW (ref 4.22–5.81)
RDW: 16.1 % — ABNORMAL HIGH (ref 11.5–15.5)
RDW: 15.3 % (ref 11.5–15.5)
RDW: 15.3 % (ref 11.5–15.5)
RDW: 15.3 % (ref 11.5–15.5)
RDW: 15.3 % (ref 11.5–15.5)
RDW: 15.4 % (ref 11.5–15.5)
WBC: 3.1 10*3/uL — ABNORMAL LOW (ref 4.0–10.5)
WBC: 2 10*3/uL — AB (ref 4.0–10.5)
WBC: 1.6 10*3/uL — ABNORMAL LOW (ref 4.0–10.5)
WBC: 1.6 10*3/uL — ABNORMAL LOW (ref 4.0–10.5)
WBC: 2.1 10*3/uL — ABNORMAL LOW (ref 4.0–10.5)
WBC: 1.9 10*3/uL — ABNORMAL LOW (ref 4.0–10.5)

## 2014-07-04 LAB — COMPREHENSIVE METABOLIC PANEL
ALK PHOS: 98 U/L (ref 39–117)
ALT: 15 U/L (ref 0–53)
AST: 51 U/L — AB (ref 0–37)
Albumin: 2.9 g/dL — ABNORMAL LOW (ref 3.5–5.2)
Anion gap: 20 — ABNORMAL HIGH (ref 5–15)
BUN: 19 mg/dL (ref 6–23)
CALCIUM: 8.6 mg/dL (ref 8.4–10.5)
CO2: 28 mEq/L (ref 19–32)
Chloride: 90 mEq/L — ABNORMAL LOW (ref 96–112)
Creatinine, Ser: 0.63 mg/dL (ref 0.50–1.35)
GFR calc non Af Amer: >90 mL/min (ref >90)
GLUCOSE: 110 mg/dL — AB (ref 70–99)
POTASSIUM: 3.9 meq/L (ref 3.7–5.3)
SODIUM: 138 meq/L (ref 137–147)
Total Bilirubin: 1.4 mg/dL — ABNORMAL HIGH (ref 0.3–1.2)
Total Protein: 6.5 g/dL (ref 6.0–8.3)

## 2014-07-04 LAB — MRSA PCR SCREENING: MRSA by PCR: NEGATIVE

## 2014-07-04 LAB — PROTIME-INR
INR: 2.16 — AB (ref 0.00–1.49)
PROTHROMBIN TIME: 24.1 s — AB (ref 11.6–15.2)

## 2014-07-04 LAB — HEPATITIS B SURFACE ANTIBODY,QUALITATIVE: HEP B S AB: NEGATIVE

## 2014-07-04 LAB — HEPATITIS B SURFACE ANTIGEN: Hepatitis B Surface Ag: NEGATIVE

## 2014-07-04 LAB — HEPATITIS C ANTIBODY (REFLEX): HCV AB: NEGATIVE

## 2014-07-04 LAB — POC OCCULT BLOOD, ED: Fecal Occult Bld: NEGATIVE

## 2014-07-04 LAB — MAGNESIUM: MAGNESIUM: 1.3 mg/dL — AB (ref 1.5–2.5)

## 2014-07-04 LAB — ABO/RH: ABO/RH(D): A POS

## 2014-07-04 LAB — PHOSPHORUS: Phosphorus: 2.9 mg/dL (ref 2.3–4.6)

## 2014-07-04 LAB — PREPARE RBC (CROSSMATCH)

## 2014-07-04 SURGERY — EGD (ESOPHAGOGASTRODUODENOSCOPY)
Anesthesia: Moderate Sedation

## 2014-07-04 MED ORDER — SUCCINYLCHOLINE CHLORIDE 20 MG/ML IJ SOLN
INTRAMUSCULAR | Status: AC | PRN
  Administered 2014-07-04: 100 mg via INTRAVENOUS

## 2014-07-04 MED ORDER — MIDAZOLAM HCL 2 MG/2ML IJ SOLN
2.0000 mg | Freq: Once | INTRAMUSCULAR | Status: DC
  Filled 2014-07-04: qty 2

## 2014-07-04 MED ORDER — FENTANYL CITRATE 0.05 MG/ML IJ SOLN
100.0000 ug | INTRAMUSCULAR | Status: DC | PRN
  Administered 2014-07-04 (×3): 100 ug via INTRAVENOUS
  Filled 2014-07-04 (×3): qty 2

## 2014-07-04 MED ORDER — OCTREOTIDE LOAD VIA INFUSION
25.0000 ug | Freq: Once | INTRAVENOUS | Status: AC
  Administered 2014-07-04: 25 ug via INTRAVENOUS
  Filled 2014-07-04: qty 13

## 2014-07-04 MED ORDER — SODIUM CHLORIDE 0.9 % IV SOLN
25.0000 ug/h | INTRAVENOUS | Status: DC
Start: 2014-07-04 — End: 2014-07-06
  Administered 2014-07-04 – 2014-07-05 (×4): 25 ug/h via INTRAVENOUS
  Filled 2014-07-04 (×7): qty 1

## 2014-07-04 MED ORDER — PANTOPRAZOLE SODIUM 40 MG IV SOLR
40.0000 mg | Freq: Once | INTRAVENOUS | Status: AC
  Administered 2014-07-04: 40 mg via INTRAVENOUS
  Filled 2014-07-04: qty 40

## 2014-07-04 MED ORDER — EPINEPHRINE HCL 0.1 MG/ML IJ SOSY
PREFILLED_SYRINGE | INTRAMUSCULAR | Status: AC
Start: 2014-07-04 — End: 2014-07-04
  Filled 2014-07-04: qty 10

## 2014-07-04 MED ORDER — FENTANYL CITRATE 0.05 MG/ML IJ SOLN: 100.0000 ug | INTRAMUSCULAR | Status: DC | PRN

## 2014-07-04 MED ORDER — ROCURONIUM BROMIDE 50 MG/5ML IV SOLN
INTRAVENOUS | Status: AC
  Filled 2014-07-04: qty 2

## 2014-07-04 MED ORDER — ETOMIDATE 2 MG/ML IV SOLN
INTRAVENOUS | Status: AC | PRN
  Administered 2014-07-04: 20 mg via INTRAVENOUS

## 2014-07-04 MED ORDER — MIDAZOLAM HCL 10 MG/2ML IJ SOLN
INTRAMUSCULAR | Status: AC
  Filled 2014-07-04: qty 2

## 2014-07-04 MED ORDER — DEXMEDETOMIDINE HCL IN NACL 200 MCG/50ML IV SOLN
0.2000 ug/kg/h | INTRAVENOUS | Status: DC
  Administered 2014-07-04: 0.5 ug/kg/h via INTRAVENOUS
  Administered 2014-07-04: 0.8 ug/kg/h via INTRAVENOUS
  Administered 2014-07-04: 0.7 ug/kg/h via INTRAVENOUS
  Administered 2014-07-04: 0.8 ug/kg/h via INTRAVENOUS
  Filled 2014-07-04 (×5): qty 50

## 2014-07-04 MED ORDER — THIAMINE HCL 100 MG/ML IJ SOLN
100.0000 mg | Freq: Every day | INTRAMUSCULAR | Status: DC
  Administered 2014-07-04 – 2014-07-07 (×4): 100 mg via INTRAVENOUS
  Filled 2014-07-04 (×4): qty 2

## 2014-07-04 MED ORDER — ONDANSETRON HCL 4 MG/2ML IJ SOLN
4.0000 mg | Freq: Once | INTRAMUSCULAR | Status: AC
  Administered 2014-07-04: 4 mg via INTRAVENOUS
  Filled 2014-07-04: qty 2

## 2014-07-04 MED ORDER — CHLORHEXIDINE GLUCONATE 0.12 % MT SOLN
15.0000 mL | Freq: Two times a day (BID) | OROMUCOSAL | Status: DC
  Administered 2014-07-04 – 2014-07-05 (×4): 15 mL via OROMUCOSAL
  Filled 2014-07-04 (×7): qty 15

## 2014-07-04 MED ORDER — BIOTENE DRY MOUTH MT LIQD
15.0000 mL | Freq: Four times a day (QID) | OROMUCOSAL | Status: DC
  Administered 2014-07-04 – 2014-07-06 (×7): 15 mL via OROMUCOSAL

## 2014-07-04 MED ORDER — FOLIC ACID 5 MG/ML IJ SOLN
1.0000 mg | Freq: Every day | INTRAMUSCULAR | Status: DC
  Administered 2014-07-04 – 2014-07-07 (×4): 1 mg via INTRAVENOUS
  Filled 2014-07-04 (×4): qty 0.2

## 2014-07-04 MED ORDER — SODIUM CHLORIDE 0.9 % IV BOLUS (SEPSIS)
3000.0000 mL | Freq: Once | INTRAVENOUS | Status: AC
  Administered 2014-07-04: 3000 mL via INTRAVENOUS

## 2014-07-04 MED ORDER — DEXMEDETOMIDINE HCL IN NACL 200 MCG/50ML IV SOLN
0.2000 ug/kg/h | INTRAVENOUS | Status: AC
Start: 2014-07-04 — End: 2014-07-05
  Administered 2014-07-04 – 2014-07-05 (×3): 1.2 ug/kg/h via INTRAVENOUS
  Filled 2014-07-04 (×4): qty 50

## 2014-07-04 MED ORDER — ETOMIDATE 2 MG/ML IV SOLN
INTRAVENOUS | Status: AC
  Filled 2014-07-04: qty 20

## 2014-07-04 MED ORDER — PANTOPRAZOLE SODIUM 40 MG IV SOLR
8.0000 mg/h | INTRAVENOUS | Status: AC
  Administered 2014-07-04 – 2014-07-06 (×7): 8 mg/h via INTRAVENOUS
  Filled 2014-07-04 (×14): qty 80

## 2014-07-04 MED ORDER — LIDOCAINE HCL 2 % EX GEL
CUTANEOUS | Status: AC
  Filled 2014-07-04: qty 10

## 2014-07-04 MED ORDER — LIDOCAINE HCL (CARDIAC) 20 MG/ML IV SOLN
INTRAVENOUS | Status: AC
  Filled 2014-07-04: qty 5

## 2014-07-04 MED ORDER — CIPROFLOXACIN IN D5W 400 MG/200ML IV SOLN
400.0000 mg | Freq: Two times a day (BID) | INTRAVENOUS | Status: DC
  Administered 2014-07-04 – 2014-07-07 (×6): 400 mg via INTRAVENOUS
  Filled 2014-07-04 (×6): qty 200

## 2014-07-04 MED ORDER — SODIUM CHLORIDE 0.9 % IV BOLUS (SEPSIS)
1000.0000 mL | Freq: Once | INTRAVENOUS | Status: AC
  Administered 2014-07-04: 1000 mL via INTRAVENOUS

## 2014-07-04 MED ORDER — LIDOCAINE HCL 2 % EX GEL
CUTANEOUS | Status: AC
  Administered 2014-07-04: 1
  Filled 2014-07-04: qty 10

## 2014-07-04 MED ORDER — PROPOFOL 10 MG/ML IV EMUL
5.0000 ug/kg/min | INTRAVENOUS | Status: DC
  Administered 2014-07-04: 30 ug/kg/min via INTRAVENOUS
  Administered 2014-07-04: 20 ug/kg/min via INTRAVENOUS
  Filled 2014-07-04 (×3): qty 100

## 2014-07-04 MED ORDER — FENTANYL CITRATE 0.05 MG/ML IJ SOLN
50.0000 ug | Freq: Once | INTRAMUSCULAR | Status: AC
  Administered 2014-07-04: 04:00:00 via INTRAVENOUS
  Filled 2014-07-04: qty 2

## 2014-07-04 MED ORDER — KCL IN DEXTROSE-NACL 20-5-0.45 MEQ/L-%-% IV SOLN
INTRAVENOUS | Status: DC
  Administered 2014-07-04 – 2014-07-06 (×4): via INTRAVENOUS
  Filled 2014-07-04 (×4): qty 1000

## 2014-07-04 MED ORDER — SODIUM CHLORIDE 0.9 % IV SOLN
250.0000 mL | INTRAVENOUS | Status: DC | PRN
  Administered 2014-07-04: 250 mL via INTRAVENOUS

## 2014-07-04 MED ORDER — PANTOPRAZOLE SODIUM 40 MG IV SOLR
40.0000 mg | Freq: Two times a day (BID) | INTRAVENOUS | Status: DC
  Administered 2014-07-07 – 2014-07-10 (×6): 40 mg via INTRAVENOUS
  Filled 2014-07-04 (×7): qty 40

## 2014-07-04 MED ORDER — FENTANYL CITRATE 0.05 MG/ML IJ SOLN
INTRAMUSCULAR | Status: AC
  Filled 2014-07-04: qty 2

## 2014-07-04 MED ORDER — CEFTRIAXONE SODIUM 1 G IJ SOLR
1.0000 g | Freq: Once | INTRAMUSCULAR | Status: AC
  Administered 2014-07-04: 1 g via INTRAVENOUS
  Filled 2014-07-04: qty 10

## 2014-07-04 MED ORDER — SUCCINYLCHOLINE CHLORIDE 20 MG/ML IJ SOLN
INTRAMUSCULAR | Status: AC
  Filled 2014-07-04: qty 1

## 2014-07-04 MED ORDER — EPINEPHRINE HCL 1 MG/ML IJ SOLN
INTRAMUSCULAR | Status: DC | PRN
  Administered 2014-07-04: 6 mL

## 2014-07-04 NOTE — Sedation Documentation (Signed)
Patient intubated with 7.5cm and 24 at the lip.

## 2014-07-04 NOTE — Op Note (Signed)
[facility] [address] [place], [id number]   ENDOSCOPY PROCEDURE REPORT  PATIENT: Brett KinsmanDoss, Torrey D.  MR#: [id number] BIRTHDATE: 1946-06-14 , 68  yrs. old GENDER: Male  ENDOSCOPIST: Charlott RakesVincent Emmarie Sannes, MD REFERRED XB:[id number]:hospital team  PROCEDURE DATE:  07/04/2014 PROCEDURE:   EGD w/ control of bleeding ASA CLASS:   Class V INDICATIONS:Hematemesis. MEDICATIONS: Per RN report   ; Patient intubated pre-procedure for airway protection  TOPICAL ANESTHETIC:  DESCRIPTION OF PROCEDURE:   After the risks benefits and alternatives of the procedure were thoroughly explained, informed consent was obtained.  The Pentax Gastroscope [id number]  endoscope was introduced through the mouth and advanced to the second portion of the duodenum , limited by Without limitations.   The instrument was slowly withdrawn as the mucosa was fully examined.     FINDINGS: The endoscope was inserted into the oropharynx and esophagus was intubated.  Large esophageal varices in the mid to distal esophagus without any bleeding stigmata noted. The gastroesophageal junction was noted to be 40 cm from the incisors where active bleeding was seen from a small Mallory-Weiss tear (MWT). A small focal white exudate was noted on the opposite wall from the MWT at the GEJ.  Endoscope was advanced into the stomach, which revealed a diffuse mosaic pattern consistent with portal hypertensive gastropathy.   The endoscope was advanced to the duodenal bulb and second portion of duodenum which were unremarkable.  The endoscope was withdrawn back into the stomach and retroflexion again revealed PHG without any gastric varices. The bleeding Mallory-Weiss tear was injected submucosally with 6 cc of epinephrine:saline 1:10,000 U mixture with hemostasis achieved. A 7 French Bicap gold probe was then used to cauterize the MWT and no active bleeding was seen following cautery of the  MWT.  COMPLICATIONS: None  ENDOSCOPIC IMPRESSION:     Bleeding Mallory-Weiss tear - s/p epi injection and Bicap gold probe cautery Large nonbleeding esophageal varices without any bleeding stigmata Portal hypertensive gastropathy  RECOMMENDATIONS: IV Octreotide, Protonix drip, IV Abx, Volume resuscitate, ICU   REPEAT EXAM: N/A  _______________________________ Charlott RakesVincent Hartleigh Edmonston, MD eSigned:  Charlott RakesVincent Javarri Segal, MD 07/04/2014 5:55 AM    CC:  PATIENT NAME:  Brett KinsmanDoss, Rishabh D. MR#: [id number]

## 2014-07-04 NOTE — Progress Notes (Addendum)
INITIAL NUTRITION ASSESSMENT  Pt meets criteria for moderate MALNUTRITION in the context of chronic illness as evidenced by <75% estimated energy intake for several months per son with moderate wasting in temporal region.  DOCUMENTATION CODES Per approved criteria  -Non-severe (moderate) malnutrition in the context chronic illness   INTERVENTION: - If pt unable to extubated in the next 24-48 hours, recommend initiation of nutrition support with close monitoring of potassium, phosphorus, and magnesium as pt at high risk of refeeding r/t alcohol abuse with minimal PO intake x 1 month per son's report - As active GI bleeding has stopped, recommend trial of enteral nutrition - RD to monitor plan of care   NUTRITION DIAGNOSIS: Inadequate oral intake related to inability to eat as evidenced by NPO.   Goal: Nutrition support to meet >90% of estimated nutritional needs  Monitor:  Weights, labs, nutrition support, vent status   Reason for Assessment: Ventilated pt   68 y.o. male  Admitting Dx: Acute upper GI bleed   ASSESSMENT: Pt with hx of COPD, acid reflux, IBS, collapsed lung, arthritis, chronic back pain, alcohol abuse, and presumed cirrhosis presenting with GI bleeding and vomiting bright red blood starting yesterday evening. Due to patient's hemodynamic instability and active bleeding, it was felt that he needed to be intubated for airway protection. Stool was brown and heme negative in ER. Had abdominal discomfort 3 days PTA. Per H&P, pt reported drinking 21 oz of alcohol per week.   - Had EGD today that showed bleeding Mallory-Weiss tear and large nonbleeding esophageal varices and portal hypertensive gastropathy - Had ultrasound of abdomen with only trace amount of ascites seen, not suitable for diagnostic paracentesis - Per telephone conversation with son, pt has been eating very poorly for the past month - 1-2 crackers/day with either strawberry jam or peanut butter. Son reports  in the past 2 weeks pt has been consuming 4-6 beers/day, down from 8-16 per day before then. He reports pt's weight has been stable. States pt sometimes drinks 1 glass of chocolate milk. Used to drink Ensure in the past, none recently.   - Per conversation with RN, GI does not want pt to have OGT or NGT placed  - Wearing safety mittens  Patient is currently intubated on ventilator support MV: 11.5 L/min Temp (24hrs), Avg:97.4 F (36.3 C), Min:97 F (36.1 C), Max:98.2 F (36.8 C)  Propofol: 3.5 ml/hr - provides 92 calories   AST and total bilirubin elevated   Nutrition Focused Physical Exam:  Subcutaneous Fat:  Orbital Region: wnl Upper Arm Region: wnl Thoracic and Lumbar Region: wnl  Muscle:  Temple Region: mild wasting  Clavicle Bone Region: wnl Clavicle and Acromion Bone Region: wnl Scapular Bone Region: n/a Dorsal Hand: n/a Patellar Region: wnl Anterior Thigh Region: wnl Posterior Calf Region: wnl  Edema: None noted    Height: Ht Readings from Last 1 Encounters:  07/04/14 5\' 8"  (1.727 m)    Weight: Wt Readings from Last 1 Encounters:  07/04/14 145 lb 8.1 oz (66 kg)    Ideal Body Weight: 154 lbs  % Ideal Body Weight: 94%  Wt Readings from Last 10 Encounters:  07/04/14 145 lb 8.1 oz (66 kg)  07/04/14 145 lb 8.1 oz (66 kg)  01/10/13 123 lb 7.3 oz (56 kg)  06/24/08 131 lb (59.421 kg)  04/15/08 129 lb (58.514 kg)  03/16/08 134 lb (60.782 kg)  03/04/08 129 lb (58.514 kg)    Usual Body Weight: 145 lbs   % Usual Body  Weight: 100%  BMI:  Body mass index is 22.13 kg/(m^2).  Estimated Nutritional Needs: Kcal: 1652  Protein: 80-100g Fluid: per MD  Skin: Stage 1 sacral pressure ulcer   Diet Order: NPO  EDUCATION NEEDS: -No education needs identified at this time   Intake/Output Summary (Last 24 hours) at 07/04/14 1111 Last data filed at 07/04/14 1000  Gross per 24 hour  Intake 966.38 ml  Output    500 ml  Net 466.38 ml    Last BM:  PTA  Labs:   Recent Labs Lab 07/04/14 0016  NA 138  K 3.9  CL 90*  CO2 28  BUN 19  CREATININE 0.63  CALCIUM 8.6  GLUCOSE 110*    CBG (last 3)  No results found for this basename: GLUCAP,  in the last 72 hours  Scheduled Meds: . antiseptic oral rinse  15 mL Mouth Rinse QID  . chlorhexidine  15 mL Mouth Rinse BID  . ciprofloxacin  400 mg Intravenous Q12H  . etomidate      . folic acid  1 mg Intravenous Daily  . lidocaine (cardiac) 100 mg/59ml      . lidocaine      . midazolam  2 mg Intravenous Once  . [START ON 07/07/2014] pantoprazole (PROTONIX) IV  40 mg Intravenous Q12H  . rocuronium      . succinylcholine      . thiamine  100 mg Intravenous Daily    Continuous Infusions: . dexmedetomidine 0.5 mcg/kg/hr (07/04/14 1026)  . octreotide (SANDOSTATIN) infusion 25 mcg/hr (07/04/14 0800)  . pantoprozole (PROTONIX) infusion 8 mg/hr (07/04/14 0800)  . propofol 20 mcg/kg/min (07/04/14 1026)    Past Medical History  Diagnosis Date  . COPD (chronic obstructive pulmonary disease)   . Acid reflux   . IBS (irritable bowel syndrome)   . Chronic back pain   . Arthritis   . Collapsed lung   . COPD (chronic obstructive pulmonary disease)     Past Surgical History  Procedure Laterality Date  . Skin graft    . Skin graft      Charlott Rakes MS, RD, LDN [phone] Pager [phone] Weekend/After Hours Pager

## 2014-07-04 NOTE — Progress Notes (Signed)
Dr. Delford FieldWright called to update on pts pupils. Day shift RN charted that pts pupils were a 3, round, reactive, sluggish bilaterally. Upon assessment tonight, right eye was a 3, round, reactive, slugglish. Left eye was a 5 or 6, round, and non-reactive. Dr. Delford FieldWright camera'd in to assess pupils. Saw that both pupils were reactive with the left pupil being larger than the other. MD said to monitor pt. No new orders given. Pt is stable. Will continue to monitor.

## 2014-07-04 NOTE — ED Notes (Signed)
Notified RN, Orvan Falconerampbell, pt. Heart rate elevated 133.

## 2014-07-04 NOTE — H&P (View-Only) (Signed)
Referring Provider: Dr. Delton CoombesByrum Primary Care Physician:  No primary provider on file. Primary Gastroenterologist:  UNASSIGNED  Reason for Consultation:  Hematemesis  HPI: [name] is a 68 y.o. male with history of alcohol cirrhosis presenting with hematemesis that started tonight and was bright red blood. Witnessed in ER. Stool was brown and heme negative in ER. Hgb 7.6 initially and now 6.2. INR 2.6. Patient intubated for airway protection due to active hematemesis in the ER. Abd Xray negative.    Past Medical History  Diagnosis Date  . COPD (chronic obstructive pulmonary disease)   . Acid reflux   . IBS (irritable bowel syndrome)   . Chronic back pain   . Arthritis   . Collapsed lung   . COPD (chronic obstructive pulmonary disease)     Past Surgical History  Procedure Laterality Date  . Skin graft    . Skin graft      Prior to Admission medications   Medication Sig Start Date End Date Taking? Authorizing Provider  Aspirin-Acetaminophen-Caffeine (GOODYS EXTRA STRENGTH) 520-260-32.5 MG PACK Take 1 Package by mouth 2 (two) times daily.   Yes Historical Provider, MD  loratadine (ALLERGY) 10 MG tablet Take 10 mg by mouth daily.   Yes Historical Provider, MD    Scheduled Meds: . antiseptic oral rinse  15 mL Mouth Rinse QID  . chlorhexidine  15 mL Mouth Rinse BID  . etomidate      . folic acid  1 mg Intravenous Daily  . lidocaine (cardiac) 100 mg/165ml      . midazolam  2 mg Intravenous Once  . [START ON 07/07/2014] pantoprazole (PROTONIX) IV  40 mg Intravenous Q12H  . rocuronium      . succinylcholine      . thiamine  100 mg Intravenous Daily   Continuous Infusions: . sodium chloride    . dexmedetomidine    . octreotide (SANDOSTATIN) infusion 25 mcg/hr (07/04/14 0230)  . pantoprozole (PROTONIX) infusion    . propofol 30 mcg/kg/min (07/04/14 0427)   PRN Meds:.sodium chloride, fentaNYL, fentaNYL  Allergies as of 07/03/2014  . (No Known Allergies)    No family  history on file.  History   Social History  . Marital Status: Married    Spouse Name: N/A    Number of Children: N/A  . Years of Education: N/A   Occupational History  . Not on file.   Social History Main Topics  . Smoking status: Current Every Day Smoker -- 0.50 packs/day    Types: Cigarettes  . Smokeless tobacco: Not on file  . Alcohol Use: 21.0 oz/week    35 Cans of beer per week  . Drug Use: No  . Sexual Activity: Not on file   Other Topics Concern  . Not on file   Social History Narrative  . No narrative on file    Review of Systems: Unable to obtain Physical Exam: Vital signs: Filed Vitals:   07/04/14 0404  BP: 173/85  Pulse: 123  Temp: 98.1  Resp: 16     General:   Intubated Neck: supple, nontender Lungs:  Coarse breath sounds Heart:  Regular rate and rhythm Abdomen: soft, nontender, nondistended, +BS Rectal:  Deferred Ext: no edema  GI:  Lab Results:  Recent Labs  07/04/14 0016 07/04/14 0350  WBC 3.1* 3.0*  HGB 7.6* 6.2*  HCT 24.0* 19.6*  PLT 107* PENDING   BMET  Recent Labs  07/04/14 0016  NA 138  K 3.9  CL 90*  CO2 28  GLUCOSE 110*  BUN 19  CREATININE 0.63  CALCIUM 8.6   LFT  Recent Labs  07/04/14 0016  PROT 6.5  ALBUMIN 2.9*  AST 51*  ALT 15  ALKPHOS 98  BILITOT 1.4*   PT/INR  Recent Labs  07/04/14 0350  LABPROT 24.1*  INR 2.16*     Studies/Results: Dg Abd 1 View  07/04/2014   CLINICAL DATA:  GI bleeding.  Nasogastric tube placement.  EXAM: ABDOMEN - 1 VIEW  COMPARISON:  Lumbar spine radiographs performed 03/08/2008  FINDINGS: The patient's nasogastric tube is noted ending at the fundus of the stomach, with the sideport at the body of the stomach.  The visualized bowel gas pattern is grossly unremarkable. No free intra-abdominal air is seen, though evaluation for free air is limited on a single supine view. Minimal degenerative change is noted at L2-L3. No acute osseous abnormalities are seen.  IMPRESSION:  Nasogastric tube noted ending at the fundus of the stomach, with the sideport at the body of the stomach.   Electronically Signed   By: Roanna Raider M.D.   On: 07/04/2014 03:52    Impression/Plan: 68 yo with cirrhosis presenting with an upper GI bleed concerning for variceal bleed. IV Octreotide, PPI, bedside EGD in ER. ICU admit by critical care. IV Abx.    LOS: 1 day   Shaune Malacara C.  07/04/2014, 4:33 AM

## 2014-07-04 NOTE — Progress Notes (Signed)
Pt transported to ICU on vent without complications, RT to monitor and assess as needed.

## 2014-07-04 NOTE — ED Provider Notes (Signed)
CSN: [id number]     Arrival date & time 07/03/14  2343 History   First MD Initiated Contact with Patient 07/03/14 2350     Chief Complaint  Patient presents with  . Hematemesis     (Consider location/radiation/quality/duration/timing/severity/associated sxs/prior Treatment) Patient is a 68 y.o. male presenting with vomiting.  Emesis Severity:  Moderate Duration:  1 day Timing:  Intermittent Emesis appearance: dark red/brown. Progression:  Unchanged Chronicity:  New Context comment:  Has been having epigastric discomfort for 2 weeks Relieved by:  Nothing Worsened by:  Nothing tried Associated symptoms: no abdominal pain, no diarrhea and no fever   Associated symptoms comment:  Fatigue, generalized weakness, no dark colored stools Risk factors: alcohol use (longstanding daily use)     Past Medical History  Diagnosis Date  . COPD (chronic obstructive pulmonary disease)   . Acid reflux   . IBS (irritable bowel syndrome)   . Chronic back pain   . Arthritis   . Collapsed lung   . COPD (chronic obstructive pulmonary disease)    Past Surgical History  Procedure Laterality Date  . Skin graft    . Skin graft     No family history on file. History  Substance Use Topics  . Smoking status: Current Every Day Smoker -- 0.50 packs/day    Types: Cigarettes  . Smokeless tobacco: Not on file  . Alcohol Use: 21.0 oz/week    35 Cans of beer per week    Review of Systems  Constitutional: Negative for fever.  HENT: Negative for congestion.   Respiratory: Negative for cough and shortness of breath.   Cardiovascular: Negative for chest pain.  Gastrointestinal: Positive for vomiting. Negative for nausea, abdominal pain and diarrhea.  All other systems reviewed and are negative.     Allergies  Review of patient's allergies indicates no known allergies.  Home Medications   Prior to Admission medications   Medication Sig Start Date End Date Taking? Authorizing Provider   ciprofloxacin (CIPRO) 500 MG tablet Take 1 tablet (500 mg total) by mouth 2 (two) times daily. 01/10/13   Clydia Llano, MD  famotidine (PEPCID) 20 MG tablet Take 20 mg by mouth 2 (two) times daily.    Historical Provider, MD  furosemide (LASIX) 40 MG tablet Take 1 tablet (40 mg total) by mouth daily. 01/10/13   Clydia Llano, MD  Multiple Vitamin (MULTIVITAMIN WITH MINERALS) TABS Take 1 tablet by mouth daily.    Historical Provider, MD  spironolactone (ALDACTONE) 100 MG tablet Take 1 tablet (100 mg total) by mouth daily. 01/10/13   Clydia Llano, MD   BP 96/49  Pulse 143  Temp(Src) 97.7 F (36.5 C) (Oral)  Resp 20  Ht 5\' 8"  (1.727 m)  Wt 130 lb (58.968 kg)  BMI 19.77 kg/m2  SpO2 96% Physical Exam  Nursing note and vitals reviewed. Constitutional: He is oriented to person, place, and time. He appears well-developed and well-nourished. No distress.  HENT:  Head: Normocephalic and atraumatic.  Mouth/Throat: Oropharynx is clear and moist.  Eyes: Conjunctivae are normal. Pupils are equal, round, and reactive to light. No scleral icterus.  Neck: Neck supple.  Cardiovascular: Regular rhythm, normal heart sounds and intact distal pulses.  Tachycardia present.   No murmur heard. Pulmonary/Chest: Effort normal and breath sounds normal. No stridor. No respiratory distress. He has no wheezes. He has no rales.  Abdominal: Soft. He exhibits distension (mild). There is tenderness in the right upper quadrant and epigastric area. There is no rigidity, no  rebound and no guarding.  Musculoskeletal: Normal range of motion. He exhibits no edema.  Neurological: He is alert and oriented to person, place, and time.  Skin: Skin is warm and dry. No rash noted.  Psychiatric: He has a normal mood and affect. His behavior is normal.    ED Course  CRITICAL CARE Performed by: Blake DivineWOFFORD, Ioma Chismar DAVID III Authorized by: Blake DivineWOFFORD, Saory Carriero DAVID III Total critical care time: 45 minutes Critical care time was exclusive of  separately billable procedures and treating other patients. Critical care was necessary to treat or prevent imminent or life-threatening deterioration of the following conditions: circulatory failure. Critical care was time spent personally by me on the following activities: development of treatment plan with patient or surrogate, discussions with consultants, evaluation of patient's response to treatment, examination of patient, obtaining history from patient or surrogate, ordering and performing treatments and interventions, ordering and review of laboratory studies, ordering and review of radiographic studies, pulse oximetry, re-evaluation of patient's condition and review of old charts.  INTUBATION Date/Time: 07/04/2014 4:18 AM Performed by: Blake DivineWOFFORD, Danney Bungert DAVID III Authorized by: Blake DivineWOFFORD, Bellany Elbaum DAVID III Consent: Verbal consent obtained. Risks and benefits: risks, benefits and alternatives were discussed Consent given by: patient Indications: airway protection Intubation method: video-assisted Patient status: paralyzed (RSI) Preoxygenation: nonrebreather mask Sedatives: etomidate Paralytic: succinylcholine Laryngoscope size: glidescope 4. Tube size: 7.5 mm Tube type: cuffed Number of attempts: 1 Cords visualized: yes Post-procedure assessment: chest rise and CO2 detector Breath sounds: equal Cuff inflated: yes ETT to lip: 23 cm Tube secured with: ETT holder Chest x-ray findings: endotracheal tube in appropriate position Patient tolerance: Patient tolerated the procedure well with no immediate complications.   (including critical care time) Labs Review Labs Reviewed  CBC - Abnormal; Notable for the following:    WBC 3.1 (*)    RBC 2.95 (*)    Hemoglobin 7.6 (*)    HCT 24.0 (*)    MCH 25.8 (*)    RDW 16.1 (*)    Platelets 107 (*)    All other components within normal limits  COMPREHENSIVE METABOLIC PANEL - Abnormal; Notable for the following:    Chloride 90 (*)    Glucose,  Bld 110 (*)    Albumin 2.9 (*)    AST 51 (*)    Total Bilirubin 1.4 (*)    Anion gap 20 (*)    All other components within normal limits  CBC - Abnormal; Notable for the following:    WBC 3.0 (*)    RBC 2.37 (*)    Hemoglobin 6.2 (*)    HCT 19.6 (*)    RDW 16.4 (*)    Platelets 92 (*)    All other components within normal limits  PROTIME-INR - Abnormal; Notable for the following:    Prothrombin Time 24.1 (*)    INR 2.16 (*)    All other components within normal limits  BLOOD GAS, ARTERIAL - Abnormal; Notable for the following:    pH, Arterial 7.273 (*)    pO2, Arterial 203.0 (*)    Bicarbonate 18.2 (*)    Acid-base deficit 7.5 (*)    All other components within normal limits  CBC  CBC  CBC  CBC  CBC  HEPATITIS B SURFACE ANTIGEN  HEPATITIS B SURFACE ANTIBODY  HEPATITIS C ANTIBODY (REFLEX)  POC OCCULT BLOOD, ED  TYPE AND SCREEN  ABO/RH  PREPARE RBC (CROSSMATCH)  PREPARE FRESH FROZEN PLASMA    Imaging Review Dg Abd 1 View  07/04/2014  CLINICAL DATA:  GI bleeding.  Nasogastric tube placement.  EXAM: ABDOMEN - 1 VIEW  COMPARISON:  Lumbar spine radiographs performed 03/08/2008  FINDINGS: The patient's nasogastric tube is noted ending at the fundus of the stomach, with the sideport at the body of the stomach.  The visualized bowel gas pattern is grossly unremarkable. No free intra-abdominal air is seen, though evaluation for free air is limited on a single supine view. Minimal degenerative change is noted at L2-L3. No acute osseous abnormalities are seen.  IMPRESSION: Nasogastric tube noted ending at the fundus of the stomach, with the sideport at the body of the stomach.   Electronically Signed   By: Roanna Raider M.D.   On: 07/04/2014 03:52   Dg Chest Port 1 View  07/04/2014   CLINICAL DATA:  Endotracheal tube placement.  EXAM: PORTABLE CHEST - 1 VIEW  COMPARISON:  Chest radiograph from 01/07/2013  FINDINGS: The patient's endotracheal tube is seen ending 3 cm above the  carina. An enteric tube is noted extending below the diaphragm.  Vascular congestion is noted. Mildly increased interstitial markings could reflect minimal interstitial edema. No pleural effusion or pneumothorax is seen.  The cardiomediastinal silhouette is normal in size. No acute osseous abnormalities are identified.  IMPRESSION: 1. Endotracheal tube seen ending 3 cm above the carina. 2. Vascular congestion noted. Mildly increased interstitial markings could reflect minimal interstitial edema.   Electronically Signed   By: Roanna Raider M.D.   On: 07/04/2014 04:32  All radiology studies independently viewed by me.      EKG Interpretation None      MDM   Final diagnoses:  Acute upper GI bleed    68 yo male with hx of cirrhosis presenting with GI bleeding.  He started vomiting this evening. He was nontoxic appearing and was initially not vomiting in the ED.  Stool was brown and heme negative.  His vital signs improved initially with IV NS.  Subsequently, however, his Hg came back at 7.6 and he began actively vomiting bright red blood.  His HR worsened, but BPs remained stable.  Continued to mentate well.  I discussed case with Critical Care who plan to admit to ICU.  I discussed case with Dr. Bosie Clos, [facility], who plan to emergently scope patient.  Due to patient's hemodynamic instability and active bleeding, it was felt that he needed to be intubated for airway protection.  This was performed without difficulty.  He was treated with IV fluids, IV protonix, IV octreotide, IV ceftriaxone.  Due to continued hemodynamic compromise, he will require blood transfusion.    Admitted to ICU  Candyce Churn III, MD 07/04/14 0630

## 2014-07-04 NOTE — Consult Note (Signed)
Ultrasound of the abdomen performed, only trance ascites seen, none suitable for diagnostic paracentesis.  Jamie KatoAaron Jameica Couts, MD Pulmonary & Critical Care Medicine July 04, 2014, 6:47 AM

## 2014-07-04 NOTE — H&P (Signed)
PULMONARY / CRITICAL CARE MEDICINE HISTORY AND PHYSICAL EXAMINATION   Name: [name] MRN: [id number] DOB: 06/20/1946    ADMISSION DATE:  07/03/2014  PRIMARY SERVICE: PCCM  CHIEF COMPLAINT:  Hematemesis  BRIEF PATIENT DESCRIPTION: 68 y/o man with a presumed history of cirrhosis with several day history of abdominal discomfort and Hematemesis that began 4-5 hours prior to presentation.  SIGNIFICANT EVENTS / STUDIES:  Hb: 7.4 Progressive hypotension  LINES / TUBES: PIV x3 (18Ga x 2, 20Ga x 1) ETT NGT  CULTURES: None  ANTIBIOTICS: Ceftriaxone  HISTORY OF PRESENT ILLNESS:  Mr. [name] is a 68 y/o man with a history of ongoing tobacco abuse, GERD, EtOH abuse, presumed cirrhosis and prior admission for ascites treated with LVP who presents with progressive abdominal discomfort and hematemesis who was found to be hypotensive, tachycardic, and with a significant anemia, all concerning for a major GI Bleed. The patient provided the history without evidence of encephalopathy or confusion, but subjectively did seem to downplay the severity of his health issues, including the details of how much blood he had vomited up today. He reports he has been in his usual state of health, taking no medications save for Goody's powder once or twice daily as well as an antihistamine, until about 3 days prior to admission when he developed abdominal discomfort. He denied BRBPR or melena. He denied hematemesis until the day of admission when he said he had only a "small" amount of blood that he threw up, about a cup or so. When his son saw this, he insisted on taking him to the ED. Critical care was called for admission. Gastroenterology was called, and requested the patient be intubated for urgent EGD.  PAST MEDICAL HISTORY :  Past Medical History  Diagnosis Date  . COPD (chronic obstructive pulmonary disease)   . Acid reflux   . IBS (irritable bowel syndrome)   . Chronic back pain   . Arthritis   .  Collapsed lung   . COPD (chronic obstructive pulmonary disease)    Past Surgical History  Procedure Laterality Date  . Skin graft    . Skin graft     Prior to Admission medications   Medication Sig Start Date End Date Taking? Authorizing Provider  Aspirin-Acetaminophen-Caffeine (GOODYS EXTRA STRENGTH) 520-260-32.5 MG PACK Take 1 Package by mouth 2 (two) times daily.   Yes Historical Provider, MD  loratadine (ALLERGY) 10 MG tablet Take 10 mg by mouth daily.   Yes Historical Provider, MD   No Known Allergies  FAMILY HISTORY:  History reviewed. No pertinent family history. SOCIAL HISTORY:  reports that he has been smoking Cigarettes.  He has been smoking about 0.50 packs per day. He does not have any smokeless tobacco history on file. He reports that he drinks about 21 ounces of alcohol per week. He reports that he does not use illicit drugs.  REVIEW OF SYSTEMS:  Pt reports he has been feeling well, save for whats mentioned in the HPI.  SUBJECTIVE:  Pt appears well, but does appear pale and mildly uncomfortable.  VITAL SIGNS: Temp:  [97.7 F (36.5 C)-98.1 F (36.7 C)] 98.1 F (36.7 C) (07/20 0219) Pulse Rate:  [113-143] 125 (07/20 0430) Resp:  [12-27] 17 (07/20 0430) BP: (96-218)/(49-126) 105/51 mmHg (07/20 0430) SpO2:  [96 %-100 %] 100 % (07/20 0430) FiO2 (%):  [60 %] 60 % (07/20 0400) Weight:  [130 lb (58.968 kg)] 130 lb (58.968 kg) (07/19 2346) HEMODYNAMICS:   VENTILATOR SETTINGS: Vent Mode:  [-]  PRVC FiO2 (%):  [60 %] 60 % Set Rate:  [16 bmp] 16 bmp Vt Set:  [550 mL] 550 mL PEEP:  [5 cmH20] 5 cmH20 Plateau Pressure:  [15 cmH20] 15 cmH20 INTAKE / OUTPUT: Intake/Output   None     PHYSICAL EXAMINATION: General:  Well appearing man, smelling of tobacco. Neuro:  Grossly intact, mentating well, no tremor. HEENT:  MMM Neck: No JVD Cardiovascular: Tachycardic Lungs:  CTA Abdomen:  Mildley distended Musculoskeletal:  No deformity or edema Skin:  Scar noted on L leg  anteriorly.  LABS:  CBC  Recent Labs Lab 07/04/14 0016 07/04/14 0350  WBC 3.1* 3.0*  HGB 7.6* 6.2*  HCT 24.0* 19.6*  PLT 107* PENDING   Coag's  Recent Labs Lab 07/04/14 0350  INR 2.16*   BMET  Recent Labs Lab 07/04/14 0016  NA 138  K 3.9  CL 90*  CO2 28  BUN 19  CREATININE 0.63  GLUCOSE 110*   Electrolytes  Recent Labs Lab 07/04/14 0016  CALCIUM 8.6   Sepsis Markers No results found for this basename: LATICACIDVEN, PROCALCITON, O2SATVEN,  in the last 168 hours ABG No results found for this basename: PHART, PCO2ART, PO2ART,  in the last 168 hours Liver Enzymes  Recent Labs Lab 07/04/14 0016  AST 51*  ALT 15  ALKPHOS 98  BILITOT 1.4*  ALBUMIN 2.9*   Cardiac Enzymes No results found for this basename: TROPONINI, PROBNP,  in the last 168 hours Glucose No results found for this basename: GLUCAP,  in the last 168 hours  Imaging Dg Abd 1 View  07/04/2014   CLINICAL DATA:  GI bleeding.  Nasogastric tube placement.  EXAM: ABDOMEN - 1 VIEW  COMPARISON:  Lumbar spine radiographs performed 03/08/2008  FINDINGS: The patient's nasogastric tube is noted ending at the fundus of the stomach, with the sideport at the body of the stomach.  The visualized bowel gas pattern is grossly unremarkable. No free intra-abdominal air is seen, though evaluation for free air is limited on a single supine view. Minimal degenerative change is noted at L2-L3. No acute osseous abnormalities are seen.  IMPRESSION: Nasogastric tube noted ending at the fundus of the stomach, with the sideport at the body of the stomach.   Electronically Signed   By: Roanna Raider M.D.   On: 07/04/2014 03:52   Dg Chest Port 1 View  07/04/2014   CLINICAL DATA:  Endotracheal tube placement.  EXAM: PORTABLE CHEST - 1 VIEW  COMPARISON:  Chest radiograph from 01/07/2013  FINDINGS: The patient's endotracheal tube is seen ending 3 cm above the carina. An enteric tube is noted extending below the diaphragm.   Vascular congestion is noted. Mildly increased interstitial markings could reflect minimal interstitial edema. No pleural effusion or pneumothorax is seen.  The cardiomediastinal silhouette is normal in size. No acute osseous abnormalities are identified.  IMPRESSION: 1. Endotracheal tube seen ending 3 cm above the carina. 2. Vascular congestion noted. Mildly increased interstitial markings could reflect minimal interstitial edema.   Electronically Signed   By: Roanna Raider M.D.   On: 07/04/2014 04:32    EKG:  Sinus Tach CXR: No infiltrate, ETT in good position.  ASSESSMENT / PLAN:  Active Problems:   GIB (gastrointestinal bleeding)   PULMONARY A: Need for mechanical Ventialtion P:   Patient has tenuous hemodynamics given significant hypovolemia, intubated for better control of hemodynamic and airway given need for invasive procedure.  CARDIOVASCULAR A: Hypotension and Tachycardia P:   Significant hypovolemia, likely  around 1/2 of circulating blood volume has been lost. Has good access now with PIVs, would now be safer to place CVC under more controlled circumstances if indicated.  RENAL A: Renal function appears normal at this time. P:   It is striking that the BUN is not elevated given GIB.  GASTROINTESTINAL A: Acute GI Bleed Acute decompensated Cirrhosis P:   Plan for urgent EGD. Supportive care (including nutrition, which may include TPN which is not available tonight).  HEMATOLOGIC A: Acute Anemia P: Likely due to losses from GIB. Aggressive volume resuscitation with IVF and PRBCs  INFECTIOUS A: No sign of active infection, will treat with ceftriaxone empirically in the setting of GIB.   ENDOCRINE A: No acute issues P:    NEUROLOGIC A: History of EtOH abuse. P:   CIWA for monitoring, will need more sedation of triggers. Thiamine and folate given IV  BEST PRACTICE / DISPOSITION Level of Care:  ICU Primary Service:  PCCM Consultants:  GI Code  Status:  Full (but does not want prolonged intubation)  Diet:  NPO DVT Px:  Rx contraindicated GI Px:  PPI (gtt and push) Skin Integrity:  intact Social / Family:  Not available  TODAY'S SUMMARY: Hx of cirrhosis, presented w/ hematemsis, found to have severe CV depression and hemodynamic instability and anemia.  I have personally obtained a history, examined the patient, evaluated laboratory and imaging results, formulated the assessment and plan and placed orders.  CRITICAL CARE: The patient is critically ill with multiple organ systems failure and requires high complexity decision making for assessment and support, frequent evaluation and titration of therapies, application of advanced monitoring technologies and extensive interpretation of multiple databases. Critical Care Time devoted to patient care services described in this note is 120 minutes.   [name], MD Pulmonary & Critical Care Medicine July 04, 2014, 5:20 AM  07/04/2014, 4:54 AM

## 2014-07-04 NOTE — Interval H&P Note (Signed)
History and Physical Interval Note:  07/04/2014 5:38 AM  Brett KinsmanKenneth D Sayler  has presented today for surgery, with the diagnosis of gi bleed  The various methods of treatment have been discussed with the patient and family. After consideration of risks, benefits and other options for treatment, the patient has consented to  Procedure(s): ESOPHAGOGASTRODUODENOSCOPY (EGD) (N/A) as a surgical intervention .  The patient's history has been reviewed, patient examined, no change in status, stable for surgery.  I have reviewed the patient's chart and labs.  Questions were answered to the patient's satisfaction.     [name] C.

## 2014-07-04 NOTE — ED Notes (Addendum)
Pt's wife, Jannet MantisCynthia Dadamo: [phone]

## 2014-07-04 NOTE — Progress Notes (Signed)
PULMONARY / CRITICAL CARE MEDICINE HISTORY AND PHYSICAL EXAMINATION   Name: [name] MRN: [id number] DOB: Oct 10, 1946    ADMISSION DATE:  07/03/2014  PRIMARY SERVICE: PCCM  CHIEF COMPLAINT:  Hematemesis  BRIEF PATIENT DESCRIPTION: 68 y/o man with a presumed history of cirrhosis with several day history of abdominal discomfort and Hematemesis that began 4-5 hours prior to presentation.  SIGNIFICANT EVENTS / STUDIES:  7/19 Admitted with Hb: 7.4 and progressive hypotension  LINES / TUBES: PIV x3 (18Ga x 2, 20Ga x 1) ETT  CULTURES: None  ANTIBIOTICS: Ceftriaxone 7/19 >> 7/20 Cipro 7/19 >>>  SUBJECTIVE/INTERVAL:  Sedated, intubated  VITAL SIGNS: Temp:  [97 F (36.1 C)-98.1 F (36.7 C)] 97 F (36.1 C) (07/20 0900) Pulse Rate:  [82-148] 86 (07/20 0900) Resp:  [12-27] 16 (07/20 0900) BP: (87-218)/(36-126) 94/44 mmHg (07/20 0900) SpO2:  [93 %-100 %] 100 % (07/20 0900) FiO2 (%):  [40 %-60 %] 40 % (07/20 0900) Weight:  [58.968 kg (130 lb)-66 kg (145 lb 8.1 oz)] 66 kg (145 lb 8.1 oz) (07/20 0637)   VENTILATOR SETTINGS: Vent Mode:  [-] PRVC FiO2 (%):  [40 %-60 %] 40 % Set Rate:  [16 bmp] 16 bmp Vt Set:  [550 mL] 550 mL PEEP:  [5 cmH20] 5 cmH20 Plateau Pressure:  [15 cmH20-18 cmH20] 18 cmH20 INTAKE / OUTPUT: Intake/Output     07/19 0701 - 07/20 0700 07/20 0701 - 07/21 0700   I.V. (mL/kg) 147 (2.2) 156.2 (2.4)   Blood 12.5 599.2   Total Intake(mL/kg) 159.5 (2.4) 755.4 (11.4)   Urine (mL/kg/hr) 500    Total Output 500     Net -340.5 +755.4         PHYSICAL EXAMINATION: General:  ill appearing male Neuro:  L pupil 4mm - sluggish, R pupil 2mm reactive, RASS -3 HEENT:  MMM Cardiovascular: regular, no murmurs Lungs:  CTA Abdomen:  Distended Musculoskeletal:  No deformity or edema Skin:  Scar noted on L leg anteriorly.  LABS:  CBC  Recent Labs Lab 07/04/14 0016 07/04/14 0350 07/04/14 0650  WBC 3.1* 3.0* 1.6*  HGB 7.6* 6.2* 7.0*  HCT 24.0* 19.6* 21.8*   PLT 107* 92* 67*   Coag's  Recent Labs Lab 07/04/14 0350  INR 2.16*   BMET  Recent Labs Lab 07/04/14 0016  NA 138  K 3.9  CL 90*  CO2 28  BUN 19  CREATININE 0.63  GLUCOSE 110*   Electrolytes  Recent Labs Lab 07/04/14 0016  CALCIUM 8.6   Sepsis Markers No results found for this basename: LATICACIDVEN, PROCALCITON, O2SATVEN,  in the last 168 hours ABG  Recent Labs Lab 07/04/14 0523  PHART 7.273*  PCO2ART 40.7  PO2ART 203.0*   Liver Enzymes  Recent Labs Lab 07/04/14 0016  AST 51*  ALT 15  ALKPHOS 98  BILITOT 1.4*  ALBUMIN 2.9*   Glucose No results found for this basename: GLUCAP,  in the last 168 hours  Imaging Dg Abd 1 View  07/04/2014   CLINICAL DATA:  GI bleeding.  Nasogastric tube placement.  EXAM: ABDOMEN - 1 VIEW  COMPARISON:  Lumbar spine radiographs performed 03/08/2008  FINDINGS: The patient's nasogastric tube is noted ending at the fundus of the stomach, with the sideport at the body of the stomach.  The visualized bowel gas pattern is grossly unremarkable. No free intra-abdominal air is seen, though evaluation for free air is limited on a single supine view. Minimal degenerative change is noted at L2-L3. No acute osseous abnormalities  are seen.  IMPRESSION: Nasogastric tube noted ending at the fundus of the stomach, with the sideport at the body of the stomach.   Electronically Signed   By: Roanna RaiderJeffery  Chang M.D.   On: 07/04/2014 03:52   Dg Chest Port 1 View  07/04/2014   CLINICAL DATA:  Endotracheal tube placement.  EXAM: PORTABLE CHEST - 1 VIEW  COMPARISON:  Chest radiograph from 01/07/2013  FINDINGS: The patient's endotracheal tube is seen ending 3 cm above the carina. An enteric tube is noted extending below the diaphragm.  Vascular congestion is noted. Mildly increased interstitial markings could reflect minimal interstitial edema. No pleural effusion or pneumothorax is seen.  The cardiomediastinal silhouette is normal in size. No acute osseous  abnormalities are identified.  IMPRESSION: 1. Endotracheal tube seen ending 3 cm above the carina. 2. Vascular congestion noted. Mildly increased interstitial markings could reflect minimal interstitial edema.   Electronically Signed   By: Roanna RaiderJeffery  Chang M.D.   On: 07/04/2014 04:32   CXR: No infiltrate, ETT in good position.  ASSESSMENT / PLAN:  PULMONARY A: Acute respiratory failure secondary to hypovolemic shock  Mild interstitual edema - monitor d/t fluid resuscitation  P:   F/u ABG, CXR Goal Ph >7.2 Increase ventilator rate to 20, decrease tidal volume to 7 cc/kg/BW F/u WUA/SBT Wean ventilator settings as tolerated  CARDIOVASCULAR A: Hypovolemic shock - improving Continues to be mildly hypotensive P:   Fluid resuscitation  No pressors needed at this time  RENAL A: Renal function appears normal at this time. Metabolic acidosis with AG 23 and delta gap 14 -  Consider GI losses  P:   F/u CMP, Mag, phos F/u ABG at 1400 and 7/21  GASTROINTESTINAL A: Acute GI Bleed Acute decompensated Cirrhosis U/s abdomen with only trace ascites seen 7/20 Hg remains low after transfusion P:   CBC q4h Transfuse 1 unit PRBCs this afternoon  HEMATOLOGIC A: Acute Anemia Thrombocytopenia GI bleed P: Likely due to losses from GIB. Aggressive volume resuscitation with IVF and PRBCs SCDs  INFECTIOUS A: Neutropenic GI bleed in cirrhotic patient P: Ct Cipro for GI prophylaxis after EGD procedure F/u CBC  ENDOCRINE A: No acute issues P:   F/u glucose on cmp  NEUROLOGIC A: History of EtOH abuse. P:   Wean sedation for RASS 0 CIWA for monitoring, will need more sedation of triggers. Thiamine and folate given IV   Summary: Patient RASS -3 this morning, plan to decrease sedation. Hypovolemic shock seems to be improving but will need 1 more unit pRBCs this afternoon.   07/04/2014, 9:28 AM   Reviewed above, examined pt and documentation changes made as needed.  68 yo  with hx of ETOH and cirrhosis presents with upper GI bleed from mallory weiss tear s/p EGD with epi injection, and large esophageal varices.  Continue octreotide, protonix, Abx per GI.  Defer NG/OG tube placement.  Continue full vent support until hemodynamics more stable.  Transfuse as needed for bleeding or Hb < 7.  Continue sedation protocol while on vent.  CC time 35 minutes.  Coralyn HellingVineet Akasia Ahmad, MD [facility] Pulmonary/Critical Care 07/04/2014, 11:30 AM Pager:  [phone] After 3pm call: [phone]

## 2014-07-04 NOTE — Consult Note (Signed)
Referring Provider: Dr. Delton CoombesByrum Primary Care Physician:  No primary provider on file. Primary Gastroenterologist:  UNASSIGNED  Reason for Consultation:  Hematemesis  HPI: [name] is a 68 y.o. male with history of alcohol cirrhosis presenting with hematemesis that started tonight and was bright red blood. Witnessed in ER. Stool was brown and heme negative in ER. Hgb 7.6 initially and now 6.2. INR 2.6. Patient intubated for airway protection due to active hematemesis in the ER. Abd Xray negative.    Past Medical History  Diagnosis Date  . COPD (chronic obstructive pulmonary disease)   . Acid reflux   . IBS (irritable bowel syndrome)   . Chronic back pain   . Arthritis   . Collapsed lung   . COPD (chronic obstructive pulmonary disease)     Past Surgical History  Procedure Laterality Date  . Skin graft    . Skin graft      Prior to Admission medications   Medication Sig Start Date End Date Taking? Authorizing Provider  Aspirin-Acetaminophen-Caffeine (GOODYS EXTRA STRENGTH) 520-260-32.5 MG PACK Take 1 Package by mouth 2 (two) times daily.   Yes Historical Provider, MD  loratadine (ALLERGY) 10 MG tablet Take 10 mg by mouth daily.   Yes Historical Provider, MD    Scheduled Meds: . antiseptic oral rinse  15 mL Mouth Rinse QID  . chlorhexidine  15 mL Mouth Rinse BID  . etomidate      . folic acid  1 mg Intravenous Daily  . lidocaine (cardiac) 100 mg/165ml      . midazolam  2 mg Intravenous Once  . [START ON 07/07/2014] pantoprazole (PROTONIX) IV  40 mg Intravenous Q12H  . rocuronium      . succinylcholine      . thiamine  100 mg Intravenous Daily   Continuous Infusions: . sodium chloride    . dexmedetomidine    . octreotide (SANDOSTATIN) infusion 25 mcg/hr (07/04/14 0230)  . pantoprozole (PROTONIX) infusion    . propofol 30 mcg/kg/min (07/04/14 0427)   PRN Meds:.sodium chloride, fentaNYL, fentaNYL  Allergies as of 07/03/2014  . (No Known Allergies)    No family  history on file.  History   Social History  . Marital Status: Married    Spouse Name: N/A    Number of Children: N/A  . Years of Education: N/A   Occupational History  . Not on file.   Social History Main Topics  . Smoking status: Current Every Day Smoker -- 0.50 packs/day    Types: Cigarettes  . Smokeless tobacco: Not on file  . Alcohol Use: 21.0 oz/week    35 Cans of beer per week  . Drug Use: No  . Sexual Activity: Not on file   Other Topics Concern  . Not on file   Social History Narrative  . No narrative on file    Review of Systems: Unable to obtain Physical Exam: Vital signs: Filed Vitals:   07/04/14 0404  BP: 173/85  Pulse: 123  Temp: 98.1  Resp: 16     General:   Intubated Neck: supple, nontender Lungs:  Coarse breath sounds Heart:  Regular rate and rhythm Abdomen: soft, nontender, nondistended, +BS Rectal:  Deferred Ext: no edema  GI:  Lab Results:  Recent Labs  07/04/14 0016 07/04/14 0350  WBC 3.1* 3.0*  HGB 7.6* 6.2*  HCT 24.0* 19.6*  PLT 107* PENDING   BMET  Recent Labs  07/04/14 0016  NA 138  K 3.9  CL 90*  CO2 28  GLUCOSE 110*  BUN 19  CREATININE 0.63  CALCIUM 8.6   LFT  Recent Labs  07/04/14 0016  PROT 6.5  ALBUMIN 2.9*  AST 51*  ALT 15  ALKPHOS 98  BILITOT 1.4*   PT/INR  Recent Labs  07/04/14 0350  LABPROT 24.1*  INR 2.16*     Studies/Results: Dg Abd 1 View  07/04/2014   CLINICAL DATA:  GI bleeding.  Nasogastric tube placement.  EXAM: ABDOMEN - 1 VIEW  COMPARISON:  Lumbar spine radiographs performed 03/08/2008  FINDINGS: The patient's nasogastric tube is noted ending at the fundus of the stomach, with the sideport at the body of the stomach.  The visualized bowel gas pattern is grossly unremarkable. No free intra-abdominal air is seen, though evaluation for free air is limited on a single supine view. Minimal degenerative change is noted at L2-L3. No acute osseous abnormalities are seen.  IMPRESSION:  Nasogastric tube noted ending at the fundus of the stomach, with the sideport at the body of the stomach.   Electronically Signed   By: Roanna Raider M.D.   On: 07/04/2014 03:52    Impression/Plan: 68 yo with cirrhosis presenting with an upper GI bleed concerning for variceal bleed. IV Octreotide, PPI, bedside EGD in ER. ICU admit by critical care. IV Abx.    LOS: 1 day   Hektor Huston C.  07/04/2014, 4:33 AM

## 2014-07-05 ENCOUNTER — Encounter (HOSPITAL_COMMUNITY): Payer: Self-pay | Admitting: Gastroenterology

## 2014-07-05 ENCOUNTER — Inpatient Hospital Stay (HOSPITAL_COMMUNITY): Payer: Medicare Other

## 2014-07-05 DIAGNOSIS — K7030 Alcoholic cirrhosis of liver without ascites: Secondary | ICD-10-CM

## 2014-07-05 DIAGNOSIS — F17200 Nicotine dependence, unspecified, uncomplicated: Secondary | ICD-10-CM

## 2014-07-05 DIAGNOSIS — K922 Gastrointestinal hemorrhage, unspecified: Secondary | ICD-10-CM

## 2014-07-05 DIAGNOSIS — F1010 Alcohol abuse, uncomplicated: Secondary | ICD-10-CM

## 2014-07-05 DIAGNOSIS — E43 Unspecified severe protein-calorie malnutrition: Secondary | ICD-10-CM

## 2014-07-05 LAB — CBC
HCT: 25.1 % — ABNORMAL LOW (ref 39.0–52.0)
HCT: 26.2 % — ABNORMAL LOW (ref 39.0–52.0)
HEMATOCRIT: 19.6 % — AB (ref 39.0–52.0)
HEMOGLOBIN: 6.2 g/dL — AB (ref 13.0–17.0)
Hemoglobin: 8.3 g/dL — ABNORMAL LOW (ref 13.0–17.0)
Hemoglobin: 8.9 g/dL — ABNORMAL LOW (ref 13.0–17.0)
MCH: 26.2 pg (ref 26.0–34.0)
MCH: 27.1 pg (ref 26.0–34.0)
MCH: 27.2 pg (ref 26.0–34.0)
MCHC: 31.6 g/dL (ref 30.0–36.0)
MCHC: 33.1 g/dL (ref 30.0–36.0)
MCHC: 34 g/dL (ref 30.0–36.0)
MCV: 82.7 fL (ref 78.0–100.0)
MCV: 82 fL (ref 78.0–100.0)
MCV: 80.1 fL (ref 78.0–100.0)
Platelets: 92 10*3/uL — ABNORMAL LOW (ref 150–400)
Platelets: 52 10*3/uL — ABNORMAL LOW (ref 150–400)
Platelets: DECREASED 10*3/uL (ref 150–400)
RBC: 2.37 MIL/uL — ABNORMAL LOW (ref 4.22–5.81)
RBC: 3.06 MIL/uL — AB (ref 4.22–5.81)
RBC: 3.27 MIL/uL — ABNORMAL LOW (ref 4.22–5.81)
RDW: 16.4 % — AB (ref 11.5–15.5)
RDW: 15.5 % (ref 11.5–15.5)
RDW: 15.6 % — ABNORMAL HIGH (ref 11.5–15.5)
WBC: 3 10*3/uL — ABNORMAL LOW (ref 4.0–10.5)
WBC: 2 10*3/uL — AB (ref 4.0–10.5)
WBC: 2.7 10*3/uL — AB (ref 4.0–10.5)

## 2014-07-05 LAB — PREPARE FRESH FROZEN PLASMA
UNIT DIVISION: 0
Unit division: 0

## 2014-07-05 LAB — BLOOD GAS, ARTERIAL
Acid-Base Excess: 2.5 mmol/L — ABNORMAL HIGH (ref 0.0–2.0)
Bicarbonate: 26 mEq/L — ABNORMAL HIGH (ref 20.0–24.0)
DRAWN BY: 362771
FIO2: 0.3 %
LHR: 16 {breaths}/min
MECHVT: 480 mL
O2 Saturation: 95.6 %
PCO2 ART: 37.6 mmHg (ref 35.0–45.0)
PEEP: 5 cmH2O
PH ART: 7.455 — AB (ref 7.350–7.450)
Patient temperature: 98.6
TCO2: 24.5 mmol/L (ref 0–100)
pO2, Arterial: 74.6 mmHg — ABNORMAL LOW (ref 80.0–100.0)

## 2014-07-05 LAB — COMPREHENSIVE METABOLIC PANEL
ALBUMIN: 2.3 g/dL — AB (ref 3.5–5.2)
ALK PHOS: 67 U/L (ref 39–117)
ALT: 14 U/L (ref 0–53)
ANION GAP: 9 (ref 5–15)
AST: 57 U/L — ABNORMAL HIGH (ref 0–37)
BILIRUBIN TOTAL: 1.4 mg/dL — AB (ref 0.3–1.2)
BUN: 20 mg/dL (ref 6–23)
CHLORIDE: 103 meq/L (ref 96–112)
CO2: 26 meq/L (ref 19–32)
Calcium: 7.3 mg/dL — ABNORMAL LOW (ref 8.4–10.5)
Creatinine, Ser: 0.61 mg/dL (ref 0.50–1.35)
GFR calc Af Amer: >90 mL/min (ref >90)
Glucose, Bld: 168 mg/dL — ABNORMAL HIGH (ref 70–99)
Potassium: 3.7 mEq/L (ref 3.7–5.3)
Sodium: 138 mEq/L (ref 137–147)
Total Protein: 5.4 g/dL — ABNORMAL LOW (ref 6.0–8.3)

## 2014-07-05 MED ORDER — DEXMEDETOMIDINE HCL IN NACL 200 MCG/50ML IV SOLN
0.2000 ug/kg/h | INTRAVENOUS | Status: AC
  Administered 2014-07-05: 0.5 ug/kg/h via INTRAVENOUS
  Administered 2014-07-05 (×2): 1.2 ug/kg/h via INTRAVENOUS
  Filled 2014-07-05 (×2): qty 50

## 2014-07-05 MED ORDER — MAGNESIUM SULFATE 40 MG/ML IJ SOLN
2.0000 g | Freq: Once | INTRAMUSCULAR | Status: AC
  Administered 2014-07-05: 2 g via INTRAVENOUS
  Filled 2014-07-05: qty 50

## 2014-07-05 MED ORDER — LORAZEPAM 2 MG/ML IJ SOLN
1.0000 mg | INTRAMUSCULAR | Status: DC | PRN
  Administered 2014-07-06: 1 mg via INTRAVENOUS
  Filled 2014-07-05 (×2): qty 1

## 2014-07-05 NOTE — Procedures (Signed)
Extubation Procedure Note  Patient Details:   Name: [name] DOB: 01/26/1946 MRN: [id number]   Airway Documentation:   Pt extubated per MD order. Cuff leak present. Pt placed on 4L [place]  Evaluation  O2 sats: stable throughout Complications: No apparent complications Patient did tolerate procedure well. Bilateral Breath Sounds: Clear Suctioning: Airway Yes  [name], [name] Royal 07/05/2014, 9:32 AM

## 2014-07-05 NOTE — Progress Notes (Signed)
eLink Physician-Brief Progress Note Patient Name: Brett KinsmanKenneth D Reyes DOB: 01/22/1946 MRN: [id number]  Date of Service  07/05/2014   HPI/Events of Note  Hypomag   eICU Interventions  mag replaced   Intervention Category Intermediate Interventions: Electrolyte abnormality - evaluation and management  Yazleemar Strassner 07/05/2014, 12:26 AM

## 2014-07-05 NOTE — Progress Notes (Signed)
Patient ID: [name], male   DOB: 08/26/1946, 68 y.o.   MRN: [id number] [facility] Progress Note  Brett KinsmanKenneth D Reyes 68 y.o. 06/08/1946   Subjective: Extubated today. No complaints. No BMs overnight or today and no further bleeding seen.  Objective: Vital signs in last 24 hours: Filed Vitals:   07/05/14 1000  BP: 120/59  Pulse: 66  Temp: 96.4 F (35.8 C)  Resp: 14    Physical Exam: Gen: lethargic, no acute distress, oriented X 3 Abd: distended, nontender, +BS  Lab Results:  Recent Labs  07/04/14 0016 07/04/14 1450 07/05/14 0235  NA 138  --  138  K 3.9  --  3.7  CL 90*  --  103  CO2 28  --  26  GLUCOSE 110*  --  168*  BUN 19  --  20  CREATININE 0.63  --  0.61  CALCIUM 8.6  --  7.3*  MG  --  1.3*  --   PHOS  --  2.9  --     Recent Labs  07/04/14 0016 07/05/14 0235  AST 51* 57*  ALT 15 14  ALKPHOS 98 67  BILITOT 1.4* 1.4*  PROT 6.5 5.4*  ALBUMIN 2.9* 2.3*    Recent Labs  07/05/14 0235 07/05/14 0625  WBC 2.0* 2.7*  HGB 8.3* 8.9*  HCT 25.1* 26.2*  MCV 82.0 80.1  PLT 52* PLATELET CLUMPS NOTED ON SMEAR, COUNT APPEARS DECREASED    Recent Labs  07/04/14 0350  LABPROT 24.1*  INR 2.16*      Assessment/Plan: 68 yo s/p GI bleed due to bleeding Mallory-Weiss tear, who also has evidence of portal hypertension with esophageal varices and portal gastropathy. Alcohol-related cirrhosis. No further bleeding. Hgb stable. Continue Octreotide and Protonix drip. NPO until more alert. Agree with alcohol withdrawal protocol. Will follow.   [name] C. 07/05/2014, 11:27 AM

## 2014-07-05 NOTE — Progress Notes (Signed)
PULMONARY / CRITICAL CARE MEDICINE HISTORY AND PHYSICAL EXAMINATION   Name: [name] MRN: [id number] DOB: 1946/10/27    ADMISSION DATE:  07/03/2014  PRIMARY SERVICE: PCCM  CHIEF COMPLAINT:  Hematemesis  BRIEF PATIENT DESCRIPTION: 68 y/o man with a presumed history of cirrhosis with several day history of abdominal discomfort and Hematemesis that began 4-5 hours prior to presentation.  SIGNIFICANT EVENTS / STUDIES:  7/19 Admitted with Hb: 7.4 and progressive hypotension 7/21 improving hypovolemic shock 7/21 Extubated on precedex  LINES / TUBES: PIV x3 (18Ga x 2, 20Ga x 1) ETT 7/19 >>> 7/21   CULTURES: None  ANTIBIOTICS: Ceftriaxone 7/19 >> 7/20 Cipro 7/19 >>>  SUBJECTIVE/INTERVAL:  Extubated on precedex   VITAL SIGNS: Temp:  [96.3 F (35.7 C)-99.7 F (37.6 C)] 97.4 F (36.3 C) (07/21 0800) Pulse Rate:  [63-103] 66 (07/21 0800) Resp:  [13-23] 16 (07/21 0800) BP: (59-124)/(30-68) 103/55 mmHg (07/21 0932) SpO2:  [95 %-100 %] 97 % (07/21 0800) FiO2 (%):  [30 %] 30 % (07/21 0800) Weight:  [70.9 kg (156 lb 4.9 oz)] 70.9 kg (156 lb 4.9 oz) (07/21 0400)   VENTILATOR SETTINGS: Vent Mode:  [-] PRVC FiO2 (%):  [30 %] 30 % Set Rate:  [16 bmp-20 bmp] 16 bmp Vt Set:  [480 mL] 480 mL PEEP:  [5 cmH20] 5 cmH20 Plateau Pressure:  [15 cmH20-16 cmH20] 15 cmH20 INTAKE / OUTPUT: Intake/Output     07/20 0701 - 07/21 0700 07/21 0701 - 07/22 0700   I.V. (mL/kg) 2899 (40.9) 57.3 (0.8)   Blood 945    IV Piggyback 450    Total Intake(mL/kg) 4294 (60.6) 57.3 (0.8)   Urine (mL/kg/hr) 1460 (0.9)    Total Output 1460     Net +2834 +57.3         PHYSICAL EXAMINATION: General:  ill appearing male Neuro:  L pupil 4mm - sluggish, R pupil 2mm reactive, follows simple commands, MAEs, RASS -1 HEENT:  MMM Cardiovascular: regular, no murmurs Lungs:  CTA Abdomen:  Distended, no pain to palpation Musculoskeletal:  No deformity or edema Skin:  Scar noted on L leg  anteriorly.  LABS:  CBC  Recent Labs Lab 07/04/14 2231 07/05/14 0235 07/05/14 0625  WBC 1.9* 2.0* 2.7*  HGB 8.5* 8.3* 8.9*  HCT 25.7* 25.1* 26.2*  PLT 61* 52* PLATELET CLUMPS NOTED ON SMEAR, COUNT APPEARS DECREASED   Coag's  Recent Labs Lab 07/04/14 0350  INR 2.16*   BMET  Recent Labs Lab 07/04/14 0016 07/05/14 0235  NA 138 138  K 3.9 3.7  CL 90* 103  CO2 28 26  BUN 19 20  CREATININE 0.63 0.61  GLUCOSE 110* 168*   Electrolytes  Recent Labs Lab 07/04/14 0016 07/04/14 1450 07/05/14 0235  CALCIUM 8.6  --  7.3*  MG  --  1.3*  --   PHOS  --  2.9  --    ABG  Recent Labs Lab 07/04/14 0523 07/04/14 1208 07/05/14 0337  PHART 7.273* 7.423 7.455*  PCO2ART 40.7 35.1 37.6  PO2ART 203.0* 63.8* 74.6*   Liver Enzymes  Recent Labs Lab 07/04/14 0016 07/05/14 0235  AST 51* 57*  ALT 15 14  ALKPHOS 98 67  BILITOT 1.4* 1.4*  ALBUMIN 2.9* 2.3*   Imaging Dg Abd 1 View  07/04/2014   CLINICAL DATA:  GI bleeding.  Nasogastric tube placement.  EXAM: ABDOMEN - 1 VIEW  COMPARISON:  Lumbar spine radiographs performed 03/08/2008  FINDINGS: The patient's nasogastric tube is noted ending at  the fundus of the stomach, with the sideport at the body of the stomach.  The visualized bowel gas pattern is grossly unremarkable. No free intra-abdominal air is seen, though evaluation for free air is limited on a single supine view. Minimal degenerative change is noted at L2-L3. No acute osseous abnormalities are seen.  IMPRESSION: Nasogastric tube noted ending at the fundus of the stomach, with the sideport at the body of the stomach.   Electronically Signed   By: Roanna RaiderJeffery  Chang M.D.   On: 07/04/2014 03:52   Dg Chest Port 1 View  07/04/2014   CLINICAL DATA:  Pulmonary edema, mechanical ventilation  EXAM: PORTABLE CHEST - 1 VIEW  COMPARISON:  07/04/2014  FINDINGS: Endotracheal tube with the tip 2.5 cm above the carina. Mild bilateral interstitial thickening. No pleural effusion,  pneumothorax or focal consolidation. Stable cardiomediastinal silhouette. Unremarkable osseous structures.  IMPRESSION: 1. Endotracheal tube with the tip 2.5 cm above the carina. 2. Mild bilateral interstitial prominence.   Electronically Signed   By: Elige KoHetal  Patel   On: 07/04/2014 12:47   Dg Chest Port 1 View  07/04/2014   CLINICAL DATA:  Endotracheal tube placement.  EXAM: PORTABLE CHEST - 1 VIEW  COMPARISON:  Chest radiograph from 01/07/2013  FINDINGS: The patient's endotracheal tube is seen ending 3 cm above the carina. An enteric tube is noted extending below the diaphragm.  Vascular congestion is noted. Mildly increased interstitial markings could reflect minimal interstitial edema. No pleural effusion or pneumothorax is seen.  The cardiomediastinal silhouette is normal in size. No acute osseous abnormalities are identified.  IMPRESSION: 1. Endotracheal tube seen ending 3 cm above the carina. 2. Vascular congestion noted. Mildly increased interstitial markings could reflect minimal interstitial edema.   Electronically Signed   By: Roanna RaiderJeffery  Chang M.D.   On: 07/04/2014 04:32    ASSESSMENT / PLAN:  PULMONARY A: Acute respiratory failure secondary to hypovolemic shock  Mild interstitual edema Extubated  P:   F/u CXR 7/22 Pulm hygiene  Keep O2 sat >92%  CARDIOVASCULAR A: Hypovolemic shock - resolved P:   Goal Map >65  RENAL A: Renal function appears normal at this time. Hypomagnesemia Metabolic acidosis - improved P:   F/u CMP, Mag, phos  GASTROINTESTINAL A: Acute GI Bleed Acute decompensated Cirrhosis U/s abdomen with only trace ascites seen 7/20 P:   CBC Daily  HEMATOLOGIC A: Acute Blood Loss Anemia - GI losses Thrombocytopenia - continues to be low - likely from ETOH Leukopenia P: Transfuse PLT for bleeding, or PLT count < 10,000 Transfuse for Hgb  < 7 F/u CBC SCDs  INFECTIOUS A: Post EGD prophylaxis P: Ct Cipro for GI prophylaxis after EGD  procedure  ENDOCRINE A: Hyperglycemia P:   F/u glucose on cmp  NEUROLOGIC A: History of ETOH abuse. P:   Wean off precedex after extubation CIWA for monitoring Ativan prn for CIWA > 8 Thiamine and folate given IV   Summary: Patient extubated this morning, plan to decrease sedation. Hypovolemic shock seems to be resolving. If continues to improve clinically may be appropriate for SDU status 7/22.   Elsie AmisJessica Koch ACNP student  07/05/2014, 9:34 AM  Reviewed above, examined, and documentation changes made as needed.  Tolerated SBT and extubated this AM.  Will need to monitor blood counts.  Wean off precedex as tolerated.  Monitor for alcohol withdrawal.  CC time 35 minutes.  Coralyn HellingVineet Feven Alderfer, MD [facility] Pulmonary/Critical Care 07/05/2014, 10:05 AM Pager:  [phone] After 3pm call: [phone]

## 2014-07-05 NOTE — Progress Notes (Signed)
NUTRITION FOLLOW UP  Intervention:   - Diet advancement per MD - Recommend continued monitoring of potassium, magnesium, and phosphorus once oral diet started and PO intake increases as pt at high risk of refeeding - RD to monitor plan of care   Nutrition Dx:   Inadequate oral intake related to inability to eat as evidenced by NPO - ongoing   Goal:   Nutrition support to meet >90% of estimated nutritional needs - not started  New goal: Advance diet as tolerated to regular diet   Monitor:   Weights, labs, diet advancement   Assessment:   Pt with hx of COPD, acid reflux, IBS, collapsed lung, arthritis, chronic back pain, alcohol abuse, and presumed cirrhosis presenting with GI bleeding and vomiting bright red blood starting yesterday evening. Due to patient's hemodynamic instability and active bleeding, it was felt that he needed to be intubated for airway protection. Stool was brown and heme negative in ER. Had abdominal discomfort 3 days PTA. Per H&P, pt reported drinking 21 oz of alcohol per week.   7/20: - Had EGD today that showed bleeding Mallory-Weiss tear and large nonbleeding esophageal varices and portal hypertensive gastropathy  - Had ultrasound of abdomen with only trace amount of ascites seen, not suitable for diagnostic paracentesis  - Per telephone conversation with son, pt has been eating very poorly for the past month - 1-2 crackers/day with either strawberry jam or peanut butter. Son reports in the past 2 weeks pt has been consuming 4-6 beers/day, down from 8-16 per day before then. He reports pt's weight has been stable. States pt sometimes drinks 1 glass of chocolate milk. Used to drink Ensure in the past, none recently.  - Per conversation with RN, GI does not want pt to have OGT or NGT placed  - Wearing safety mittens  7/21: - Extubated today - Alone and asleep in room - Per NP, plan is to keep NPO today and advance diet tomorrow per GI - Magnesium being monitored  and replaced - Continues on CIWA protocol  - Getting thiamine, folic acid, magnesium, and potassium    Potassium  Date/Time Value Ref Range Status  07/05/2014  2:35 AM 3.7  3.7 - 5.3 mEq/L Final     REPEATED TO VERIFY  07/04/2014 12:16 AM 3.9  3.7 - 5.3 mEq/L Final  01/10/2013  6:55 AM 4.3  3.5 - 5.1 mEq/L Final    Phosphorus  Date/Time Value Ref Range Status  07/04/2014  2:50 PM 2.9  2.3 - 4.6 mg/dL Final    Magnesium  Date/Time Value Ref Range Status  07/04/2014  2:50 PM 1.3* 1.5 - 2.5 mg/dL Final     Height: Ht Readings from Last 1 Encounters:  07/04/14 5\' 8"  (1.727 m)    Weight Status:   Wt Readings from Last 1 Encounters:  07/05/14 156 lb 4.9 oz (70.9 kg)  Admit wt         130 lb (58.9 kg) Net I/Os: +2.9L  Re-estimated needs:  Kcal: 1800-2000 Protein: 90-110g Fluid: per MD  Skin: Intact   Diet Order: NPO   Intake/Output Summary (Last 24 hours) at 07/05/14 1254 Last data filed at 07/05/14 1215  Gross per 24 hour  Intake 3571.46 ml  Output   1210 ml  Net 2361.46 ml    Last BM: PTA   Labs:   Recent Labs Lab 07/04/14 0016 07/04/14 1450 07/05/14 0235  NA 138  --  138  K 3.9  --  3.7  CL 90*  --  103  CO2 28  --  26  BUN 19  --  20  CREATININE 0.63  --  0.61  CALCIUM 8.6  --  7.3*  MG  --  1.3*  --   PHOS  --  2.9  --   GLUCOSE 110*  --  168*    CBG (last 3)  No results found for this basename: GLUCAP,  in the last 72 hours  Scheduled Meds: . antiseptic oral rinse  15 mL Mouth Rinse QID  . chlorhexidine  15 mL Mouth Rinse BID  . ciprofloxacin  400 mg Intravenous Q12H  . folic acid  1 mg Intravenous Daily  . [START ON 07/07/2014] pantoprazole (PROTONIX) IV  40 mg Intravenous Q12H  . thiamine  100 mg Intravenous Daily    Continuous Infusions: . dextrose 5 % and 0.45 % NaCl with KCl 20 mEq/L 75 mL/hr at 07/05/14 0035  . octreotide (SANDOSTATIN) infusion 25 mcg/hr (07/04/14 2117)  . pantoprozole (PROTONIX) infusion 8 mg/hr (07/05/14 0335)     Charlott Rakes MS, RD, LDN [phone] Pager [phone] Weekend/After Hours Pager

## 2014-07-06 ENCOUNTER — Inpatient Hospital Stay (HOSPITAL_COMMUNITY): Payer: Medicare Other

## 2014-07-06 DIAGNOSIS — K567 Ileus, unspecified: Secondary | ICD-10-CM | POA: Diagnosis present

## 2014-07-06 DIAGNOSIS — K560 Paralytic ileus: Secondary | ICD-10-CM

## 2014-07-06 DIAGNOSIS — R188 Other ascites: Secondary | ICD-10-CM

## 2014-07-06 LAB — CBC
HEMATOCRIT: 24.3 % — AB (ref 39.0–52.0)
Hemoglobin: 8.1 g/dL — ABNORMAL LOW (ref 13.0–17.0)
MCH: 26.9 pg (ref 26.0–34.0)
MCHC: 33.3 g/dL (ref 30.0–36.0)
MCV: 80.7 fL (ref 78.0–100.0)
Platelets: 65 10*3/uL — ABNORMAL LOW (ref 150–400)
RBC: 3.01 MIL/uL — ABNORMAL LOW (ref 4.22–5.81)
RDW: 15.8 % — ABNORMAL HIGH (ref 11.5–15.5)
WBC: 2.5 10*3/uL — ABNORMAL LOW (ref 4.0–10.5)

## 2014-07-06 LAB — BASIC METABOLIC PANEL
Anion gap: 11 (ref 5–15)
Anion gap: 7 (ref 5–15)
BUN: 13 mg/dL (ref 6–23)
BUN: 8 mg/dL (ref 6–23)
CHLORIDE: 102 meq/L (ref 96–112)
CO2: 25 mEq/L (ref 19–32)
CO2: 28 mEq/L (ref 19–32)
Calcium: 7.4 mg/dL — ABNORMAL LOW (ref 8.4–10.5)
Calcium: 7.3 mg/dL — ABNORMAL LOW (ref 8.4–10.5)
Chloride: 101 mEq/L (ref 96–112)
Creatinine, Ser: 0.56 mg/dL (ref 0.50–1.35)
Creatinine, Ser: 0.57 mg/dL (ref 0.50–1.35)
GFR calc Af Amer: >90 mL/min (ref >90)
GFR calc Af Amer: >90 mL/min (ref >90)
GFR calc non Af Amer: >90 mL/min (ref >90)
GLUCOSE: 117 mg/dL — AB (ref 70–99)
GLUCOSE: 110 mg/dL — AB (ref 70–99)
POTASSIUM: 3.2 meq/L — AB (ref 3.7–5.3)
Potassium: 3.1 mEq/L — ABNORMAL LOW (ref 3.7–5.3)
Sodium: 137 mEq/L (ref 137–147)
Sodium: 137 mEq/L (ref 137–147)

## 2014-07-06 LAB — MAGNESIUM
Magnesium: 1.4 mg/dL — ABNORMAL LOW (ref 1.5–2.5)
Magnesium: 1.7 mg/dL (ref 1.5–2.5)

## 2014-07-06 MED ORDER — LORAZEPAM 2 MG/ML IJ SOLN
1.0000 mg | INTRAMUSCULAR | Status: DC | PRN
  Administered 2014-07-06: 2 mg via INTRAVENOUS
  Administered 2014-07-06: 1 mg via INTRAVENOUS
  Administered 2014-07-07: 2 mg via INTRAVENOUS
  Filled 2014-07-06 (×2): qty 1

## 2014-07-06 MED ORDER — POTASSIUM CHLORIDE 10 MEQ/100ML IV SOLN
10.0000 meq | INTRAVENOUS | Status: AC
  Administered 2014-07-06 (×5): 10 meq via INTRAVENOUS
  Filled 2014-07-06 (×5): qty 100

## 2014-07-06 MED ORDER — BIOTENE DRY MOUTH MT LIQD
15.0000 mL | Freq: Two times a day (BID) | OROMUCOSAL | Status: DC
  Administered 2014-07-06 – 2014-07-09 (×7): 15 mL via OROMUCOSAL

## 2014-07-06 MED ORDER — DEXMEDETOMIDINE HCL IN NACL 200 MCG/50ML IV SOLN
0.2000 ug/kg/h | INTRAVENOUS | Status: AC
  Administered 2014-07-06: 0.2 ug/kg/h via INTRAVENOUS
  Filled 2014-07-06: qty 50

## 2014-07-06 MED ORDER — POTASSIUM CHLORIDE IN NACL 20-0.45 MEQ/L-% IV SOLN
INTRAVENOUS | Status: DC
  Administered 2014-07-06 – 2014-07-07 (×2): via INTRAVENOUS
  Filled 2014-07-06 (×3): qty 1000

## 2014-07-06 MED ORDER — POTASSIUM CHLORIDE 10 MEQ/100ML IV SOLN
10.0000 meq | INTRAVENOUS | Status: AC
  Administered 2014-07-06 (×4): 10 meq via INTRAVENOUS
  Filled 2014-07-06 (×4): qty 100

## 2014-07-06 MED ORDER — POTASSIUM CHLORIDE 10 MEQ/50ML IV SOLN
10.0000 meq | INTRAVENOUS | Status: DC
  Filled 2014-07-06: qty 50

## 2014-07-06 MED ORDER — MAGNESIUM SULFATE 40 MG/ML IJ SOLN
2.0000 g | Freq: Once | INTRAMUSCULAR | Status: AC
  Administered 2014-07-06: 2 g via INTRAVENOUS
  Filled 2014-07-06: qty 50

## 2014-07-06 MED ORDER — CHLORHEXIDINE GLUCONATE 0.12 % MT SOLN
15.0000 mL | Freq: Two times a day (BID) | OROMUCOSAL | Status: DC
  Administered 2014-07-06 – 2014-07-10 (×9): 15 mL via OROMUCOSAL
  Filled 2014-07-06 (×11): qty 15

## 2014-07-06 NOTE — Progress Notes (Signed)
eLink Physician-Brief Progress Note Patient Name: Brett KinsmanKenneth D Reyes DOB: 02/05/1946 MRN: [id number]  Date of Service  07/06/2014   HPI/Events of Note   Hypokalemia despite multiple runs of KCl   eICU Interventions  Repeat KCL x4 runs Remove dextrose from IVF   Intervention Category Minor Interventions: Electrolytes abnormality - evaluation and management  MCQUAID, DOUGLAS 07/06/2014, 5:46 PM

## 2014-07-06 NOTE — Progress Notes (Signed)
Dr. Delton CoombesByrum called to update on pts status. Pt called RN into room due to not feeling well. Pt had general malaise throughout night, but suddenly felt "horrible". No pain stated. Pt was tachycardic, blood pressure was elevated. Temp via foley was 100.5. Oxygen was 97% on 2L [place]. Hgb this morning was 8.1. Hematocrit was 24.3. Pt is alert and oriented. MD did not give any new orders. Just to continue to monitor.

## 2014-07-06 NOTE — Progress Notes (Signed)
Patient started to become more agitated, tachycardic, and and confused. CIWA of 17 this morning. PRN Ativan given. MD Sood notified. Awaiting orders.

## 2014-07-06 NOTE — Progress Notes (Signed)
eLink Physician-Brief Progress Note Patient Name: Brett KinsmanKenneth D Reyes DOB: 04/23/1946 MRN: [id number]  Date of Service  07/06/2014   HPI/Events of Note     eICU Interventions  Potassium replaced   Intervention Category Intermediate Interventions: Electrolyte abnormality - evaluation and management  BYRUM,ROBERT S. 07/06/2014, 6:26 AM

## 2014-07-06 NOTE — Progress Notes (Signed)
PULMONARY / CRITICAL CARE MEDICINE HISTORY AND PHYSICAL EXAMINATION   Name: [name] MRN: [id number] DOB: [birth year]    ADMISSION DATE:  07/03/2014  PRIMARY SERVICE: PCCM  CHIEF COMPLAINT:  Hematemesis  BRIEF PATIENT DESCRIPTION: 68 y/o man with a presumed history of cirrhosis with several day history of abdominal discomfort and Hematemesis that began 4-5 hours prior to presentation.  SIGNIFICANT EVENTS:  7/19 Admit, GI consulted, EGD 7/21 improving hypovolemic shock 7/21 Extubated on precedex 7/22 Mild ileus  STUDIES: 7/20 EGD >> bleeding mallory weiss tear s/p epi injection and cautery, large non bleeding esophageal varices, portal HTN gastropathy 7/21 Abd u/s >> gallstones, normal CBD, ascites, Rt pleural effusion  SUBJECTIVE/INTERVAL:  Oxygenating well  C/o generalized abdominal discomfort   VITAL SIGNS: Temp:  [96.3 F (35.7 C)-100.6 F (38.1 C)] 100.6 F (38.1 C) (07/22 0800) Pulse Rate:  [65-117] 103 (07/22 0800) Resp:  [13-27] 21 (07/22 0800) BP: (103-158)/(27-88) 143/73 mmHg (07/22 0800) SpO2:  [87 %-100 %] 95 % (07/22 0800) Weight:  [70.2 kg (154 lb 12.2 oz)] 70.2 kg (154 lb 12.2 oz) (07/22 0500)   INTAKE / OUTPUT: Intake/Output     07/21 0701 - 07/22 0700 07/22 0701 - 07/23 0700   I.V. (mL/kg) 2615.9 (37.3) 87.5 (1.2)   Blood     IV Piggyback 300 200   Total Intake(mL/kg) 2915.9 (41.5) 287.5 (4.1)   Urine (mL/kg/hr) 2025 (1.2)    Total Output 2025     Net +890.9 +287.5         PHYSICAL EXAMINATION: General:  ill appearing male Neuro:  L pupil 4mm - sluggish, R pupil 2mm reactive, follows simple commands, MAEs, RASS -1 HEENT:  MMM Cardiovascular: regular, no murmurs Lungs:  CTA Abdomen:  More distended Musculoskeletal:  No deformity or edema Skin:  Scar noted on L leg anteriorly.  LABS:  CBC  Recent Labs Lab 07/05/14 0235 07/05/14 0625 07/06/14 0333  WBC 2.0* 2.7* 2.5*  HGB 8.3* 8.9* 8.1*  HCT 25.1* 26.2* 24.3*  PLT 52* PLATELET  CLUMPS NOTED ON SMEAR, COUNT APPEARS DECREASED 65*   Coag's  Recent Labs Lab 07/04/14 0350  INR 2.16*   BMET  Recent Labs Lab 07/04/14 0016 07/05/14 0235 07/06/14 0333  NA 138 138 137  K 3.9 3.7 3.1*  CL 90* 103 101  CO2 28 26 25   BUN 19 20 13   CREATININE 0.63 0.61 0.56  GLUCOSE 110* 168* 117*   Electrolytes  Recent Labs Lab 07/04/14 0016 07/04/14 1450 07/05/14 0235 07/06/14 0333  CALCIUM 8.6  --  7.3* 7.4*  MG  --  1.3*  --  1.4*  PHOS  --  2.9  --   --    ABG  Recent Labs Lab 07/04/14 0523 07/04/14 1208 07/05/14 0337  PHART 7.273* 7.423 7.455*  PCO2ART 40.7 35.1 37.6  PO2ART 203.0* 63.8* 74.6*   Liver Enzymes  Recent Labs Lab 07/04/14 0016 07/05/14 0235  AST 51* 57*  ALT 15 14  ALKPHOS 98 67  BILITOT 1.4* 1.4*  ALBUMIN 2.9* 2.3*   Imaging US Abdomen Complete  07/05/2014   CLINICAL DATA:  Portal hypertension, cirrhosis  EXAM: ULTRASOUND ABDOMEN COMPLETE  COMPARISON:  01/07/2013  FINDINGS: Gallbladder:  Gallstones are noted within gallbladder the largest measures 3.6 mm. There is borderline thickening of gallbladder wall up to 3.1 mm. Perihepatic ascites is noted. No sonographic Murphy's sign.  Common bile duct:  Diameter: 3 mm in diameter within normal limits.  Liver:  Again noted nodular  contour of the liver with diffuse increased echogenicity consistent with cirrhosis. No focal hepatic mass.  IVC:  No abnormality visualized.  Pancreas:  Visualized portion unremarkable.  Spleen:  Size and appearance within normal limits.  Measures 11 cm in length.  Right Kidney:  Length: 11 cm length. Echogenicity within normal limits. No mass or hydronephrosis visualized.  Left Kidney:  Length: 11 cm length. Echogenicity within normal limits. No mass or hydronephrosis visualized.  Abdominal aorta:  No aneurysm visualized.  Measures up to 2.2 cm in diameter.  Other findings:  Abdominal ascites is noted.  There is right pleural effusion.  IMPRESSION: 1. Gallstones are  noted within gallbladder. The largest measures 3.6 mm. Borderline thickening of gallbladder wall without sonographic Murphy's sign. 2. Normal CBD. There is again noted cirrhotic liver without focal hepatic mass. 3. No hydronephrosis. 4. Abdominal ascites.  Right pleural effusion.   Electronically Signed   By: Natasha MeadLiviu  Pop M.D.   On: 07/05/2014 15:22   Dg Chest Port 1 View  07/04/2014   CLINICAL DATA:  Pulmonary edema, mechanical ventilation  EXAM: PORTABLE CHEST - 1 VIEW  COMPARISON:  07/04/2014  FINDINGS: Endotracheal tube with the tip 2.5 cm above the carina. Mild bilateral interstitial thickening. No pleural effusion, pneumothorax or focal consolidation. Stable cardiomediastinal silhouette. Unremarkable osseous structures.  IMPRESSION: 1. Endotracheal tube with the tip 2.5 cm above the carina. 2. Mild bilateral interstitial prominence.   Electronically Signed   By: Elige KoHetal  Patel   On: 07/04/2014 12:47    ASSESSMENT / PLAN:  PULMONARY ETT 7/19 >>> 7/21  A: Acute respiratory failure secondary to hypovolemic shock >> improved. P:   Pulm hygiene  Keep O2 sat >92%  CARDIOVASCULAR A: Tachycardia - related to withdrawal. Hypovolemic shock - resolved. P:   Continue to monitor tachycardia, will treat with ativan at this time Goal Map >65  RENAL A: Hypokalemia. Hypomagnesemia. P:   F/u and replace electrolytes as needed  GASTROINTESTINAL A: Acute GI Bleed 2nd to [employer]allory Weiss tear. Alcoholic cirrhosis with esophageal varices, ascites, and portal gastropathy. Ileus developed 7/22. P:   F/u Abd xr NPO for now Protonix and octreotide gtt per GI  HEMATOLOGIC A: Acute blood loss anemia 2nd to Upper GI bleeding >> Hb stable. Thrombocytopenia 2nd to ETOH >> PLT stable. Leukopenia. P: F/u CBC SCD for DVT prevention  INFECTIOUS A: Post EGD prophylaxis P: Cipro started 7/19 >>> 7/26 (7 day course)  ENDOCRINE A: Hyperglycemia - improving. P:   F/u glucose on  BMET  NEUROLOGIC A: History of ETOH abuse. P:    CIWA for monitoring Ativan prn for CIWA > 8 Thiamine and folate given IV   Summary: Patient oxygenating well this am on 2L [place]. His abdomen appears to be more distended and the patient is c/o generalized discomfort. Will need to f/u on abd xr today to evaluate for illeus. Will need to continue to monitor for worsening signs and symptoms of withdrawal.   Elsie AmisJessica Koch ACNP student  07/06/2014, 8:55 AM  Reviewed above, examined and documentation changes made as needed.  Respiratory status stable after extubation.  He appears slightly more agitated with low grade temperature and tachycardia >> concerned he might developed withdrawal in next 24 to 48 hours >> will continue CIWA scoring and prn ativan.  He also has developed mild ileus.  Coralyn HellingVineet Nakema Fake, MD [facility] 07/06/2014, 9:42 AM Pager:  [phone] After 3pm call: [phone]

## 2014-07-06 NOTE — Progress Notes (Signed)
Called E-link and spoke with nurse about K+ 3.2 after 5 runs of K+. Also made aware of swollen scrotum and genitalia area. Nurse advised MD was on phone but would follow-up. No new orders. Will continue to monitor.

## 2014-07-06 NOTE — Progress Notes (Signed)
Patient ID: [name], male   DOB: 01/06/1946, 68 y.o.   MRN: [id number] [facility]  Brett KinsmanKenneth D Reyes 68 y.o. 02/16/1946   Subjective: More alert. No further bleeding. Wife and friend at bedside.  Objective: Vital signs in last 24 hours: Filed Vitals:   07/06/14 1600  BP: 130/71  Pulse: 97  Temp: 99 F (37.2 C)  Resp: 16    Physical Exam: Gen: more alert, no acute distress, disheveled Abd: distended, nontender, decreased bowel sounds  Lab Results:  Recent Labs  07/04/14 0016 07/04/14 1450 07/05/14 0235 07/06/14 0333  NA 138  --  138 137  K 3.9  --  3.7 3.1*  CL 90*  --  103 101  CO2 28  --  26 25  GLUCOSE 110*  --  168* 117*  BUN 19  --  20 13  CREATININE 0.63  --  0.61 0.56  CALCIUM 8.6  --  7.3* 7.4*  MG  --  1.3*  --  1.4*  PHOS  --  2.9  --   --     Recent Labs  07/04/14 0016 07/05/14 0235  AST 51* 57*  ALT 15 14  ALKPHOS 98 67  BILITOT 1.4* 1.4*  PROT 6.5 5.4*  ALBUMIN 2.9* 2.3*    Recent Labs  07/05/14 0625 07/06/14 0333  WBC 2.7* 2.5*  HGB 8.9* 8.1*  HCT 26.2* 24.3*  MCV 80.1 80.7  PLT PLATELET CLUMPS NOTED ON SMEAR, COUNT APPEARS DECREASED 65*    Recent Labs  07/04/14 0350  LABPROT 24.1*  INR 2.16*      Assessment/Plan: 68 yo s/p GI bleed from Mallory-Weiss tear who also has cirrhosis from alcohol and evidence of portal hypertension. Distended abdomen and Xray consistent with an ileus. Would keep NPO. Replace K. IVFs, Supportive care. Hold off on any laxatives at this time. Agree with discontinuing Octreotide. Continue Protonix drip but if stable ok to change to IV PPI Q 12 hours tomorrow. Will follow.   Drenda Sobecki C. 07/06/2014, 4:39 PM

## 2014-07-06 NOTE — Progress Notes (Signed)
Increase in confusion, hallucinating, and remains tachycardic. MD Sood paged and started Precedex drip.

## 2014-07-06 NOTE — Progress Notes (Signed)
Spoke with Dr. Bosie ClosSchooler with GI about patient's abdominal distention. Will stop Octreotide now and start bowel regimen per GI recommendations after they round on the patient.

## 2014-07-06 NOTE — Progress Notes (Signed)
Pt becoming more confused/agitated, hallucinating, trying to get out of bed.  Will resume precedex.  Coralyn HellingVineet Denisa Enterline, MD [facility] 07/06/2014, 10:37 AM Pager:  [phone] After 3pm call: [phone]

## 2014-07-07 DIAGNOSIS — J449 Chronic obstructive pulmonary disease, unspecified: Secondary | ICD-10-CM

## 2014-07-07 LAB — BASIC METABOLIC PANEL
ANION GAP: 8 (ref 5–15)
Anion gap: 11 (ref 5–15)
BUN: 6 mg/dL (ref 6–23)
BUN: 6 mg/dL (ref 6–23)
CALCIUM: 7.5 mg/dL — AB (ref 8.4–10.5)
CO2: 29 meq/L (ref 19–32)
CO2: 29 meq/L (ref 19–32)
CREATININE: 0.51 mg/dL (ref 0.50–1.35)
Calcium: 7.9 mg/dL — ABNORMAL LOW (ref 8.4–10.5)
Chloride: 99 mEq/L (ref 96–112)
Chloride: 92 mEq/L — ABNORMAL LOW (ref 96–112)
Creatinine, Ser: 0.52 mg/dL (ref 0.50–1.35)
GFR calc Af Amer: >90 mL/min (ref >90)
GFR calc non Af Amer: >90 mL/min (ref >90)
Glucose, Bld: 99 mg/dL (ref 70–99)
Glucose, Bld: 141 mg/dL — ABNORMAL HIGH (ref 70–99)
Potassium: 3.5 mEq/L — ABNORMAL LOW (ref 3.7–5.3)
Potassium: 3 mEq/L — ABNORMAL LOW (ref 3.7–5.3)
Sodium: 136 mEq/L — ABNORMAL LOW (ref 137–147)
Sodium: 132 mEq/L — ABNORMAL LOW (ref 137–147)

## 2014-07-07 LAB — CBC
HCT: 26.9 % — ABNORMAL LOW (ref 39.0–52.0)
Hemoglobin: 8.8 g/dL — ABNORMAL LOW (ref 13.0–17.0)
MCH: 27.1 pg (ref 26.0–34.0)
MCHC: 32.7 g/dL (ref 30.0–36.0)
MCV: 82.8 fL (ref 78.0–100.0)
PLATELETS: 64 10*3/uL — AB (ref 150–400)
RBC: 3.25 MIL/uL — AB (ref 4.22–5.81)
RDW: 16 % — ABNORMAL HIGH (ref 11.5–15.5)
WBC: 1.7 10*3/uL — ABNORMAL LOW (ref 4.0–10.5)

## 2014-07-07 LAB — MAGNESIUM
MAGNESIUM: 2.1 mg/dL (ref 1.5–2.5)
Magnesium: 1.4 mg/dL — ABNORMAL LOW (ref 1.5–2.5)

## 2014-07-07 LAB — PHOSPHORUS: Phosphorus: 2.9 mg/dL (ref 2.3–4.6)

## 2014-07-07 MED ORDER — NICOTINE 21 MG/24HR TD PT24
21.0000 mg | MEDICATED_PATCH | Freq: Every day | TRANSDERMAL | Status: DC
  Administered 2014-07-07 – 2014-07-10 (×4): 21 mg via TRANSDERMAL
  Filled 2014-07-07 (×4): qty 1

## 2014-07-07 MED ORDER — IPRATROPIUM-ALBUTEROL 0.5-2.5 (3) MG/3ML IN SOLN: 3.0000 mL | RESPIRATORY_TRACT | Status: DC | PRN

## 2014-07-07 MED ORDER — METOPROLOL TARTRATE 25 MG PO TABS
25.0000 mg | ORAL_TABLET | Freq: Two times a day (BID) | ORAL | Status: DC
  Administered 2014-07-07 – 2014-07-10 (×7): 25 mg via ORAL
  Filled 2014-07-07 (×8): qty 1

## 2014-07-07 MED ORDER — FOLIC ACID 1 MG PO TABS
1.0000 mg | ORAL_TABLET | Freq: Every day | ORAL | Status: DC
  Administered 2014-07-08 – 2014-07-10 (×3): 1 mg via ORAL
  Filled 2014-07-07 (×3): qty 1

## 2014-07-07 MED ORDER — CIPROFLOXACIN IN D5W 400 MG/200ML IV SOLN
400.0000 mg | Freq: Two times a day (BID) | INTRAVENOUS | Status: DC
  Administered 2014-07-07 – 2014-07-10 (×6): 400 mg via INTRAVENOUS
  Filled 2014-07-07 (×7): qty 200

## 2014-07-07 MED ORDER — VITAMIN B-1 100 MG PO TABS
100.0000 mg | ORAL_TABLET | Freq: Every day | ORAL | Status: DC
  Administered 2014-07-08 – 2014-07-10 (×3): 100 mg via ORAL
  Filled 2014-07-07 (×3): qty 1

## 2014-07-07 MED ORDER — SODIUM CHLORIDE 0.9 % IV SOLN
INTRAVENOUS | Status: DC
  Administered 2014-07-07 – 2014-07-08 (×2): via INTRAVENOUS

## 2014-07-07 MED ORDER — MAGNESIUM SULFATE 4000MG/100ML IJ SOLN
4.0000 g | Freq: Once | INTRAMUSCULAR | Status: AC
  Administered 2014-07-07: 4 g via INTRAVENOUS
  Filled 2014-07-07: qty 100

## 2014-07-07 NOTE — Progress Notes (Signed)
Patient ID: Brett KinsmanKenneth D Reyes, male   DOB: 04/24/1946, 68 y.o.   MRN: [id number] [facility]  Brett KinsmanKenneth D Reyes 68 y.o. 05/23/1946   Subjective: Watching TV lying in bed. No complaints. Reports falling out of the chair earlier and reports hitting his elbow. Tolerating liquids.  Objective: Vital signs in last 24 hours: Filed Vitals:   07/07/14 1400  BP: 161/83  Pulse: 90  Temp: 97.9  Resp: 20    Physical Exam: Gen: alert, no acute distress Abd: softer, minimal tenderness, less distended  Lab Results:  Recent Labs  07/04/14 1450  07/06/14 1608 07/07/14 0343  NA  --   < > 137 136*  K  --   < > 3.2* 3.5*  CL  --   < > 102 99  CO2  --   < > 28 29  GLUCOSE  --   < > 110* 99  BUN  --   < > 8 6  CREATININE  --   < > 0.57 0.51  CALCIUM  --   < > 7.3* 7.5*  MG 1.3*  < > 1.7 1.4*  PHOS 2.9  --   --  2.9  < > = values in this interval not displayed.  Recent Labs  07/05/14 0235  AST 57*  ALT 14  ALKPHOS 67  BILITOT 1.4*  PROT 5.4*  ALBUMIN 2.3*    Recent Labs  07/06/14 0333 07/07/14 0343  WBC 2.5* 1.7*  HGB 8.1* 8.8*  HCT 24.3* 26.9*  MCV 80.7 82.8  PLT 65* 64*   No results found for this basename: LABPROT, INR,  in the last 72 hours    Assessment/Plan: 68 yo s/p GI bleed (Mallory-Weiss tear) who also has cirrhosis and evidence of portal HTN with esophageal varices and portal gastropathy. Hgb stable. No further bleeding. Mild ileus resolving clinically. Advance diet as tolerated to low sodium diet. Will sign off. Call if questions.   [name] C. 07/07/2014, 2:30 PM

## 2014-07-07 NOTE — Progress Notes (Signed)
PULMONARY / CRITICAL CARE MEDICINE HISTORY AND PHYSICAL EXAMINATION   Name: [name] MRN: [id number] DOB: 01-10-1946    ADMISSION DATE:  07/03/2014  PRIMARY SERVICE: PCCM  CHIEF COMPLAINT:  Hematemesis  BRIEF PATIENT DESCRIPTION: 68 y/o man with a presumed history of cirrhosis with several day history of abdominal discomfort and Hematemesis that began 4-5 hours prior to presentation.  SIGNIFICANT EVENTS:  7/19 Admit, GI consulted, EGD 7/21 improving hypovolemic shock 7/21 Extubated on precedex 7/22 Mild ileus 7/23 Improving agitation and abdominal distention  STUDIES: 7/20 EGD >> bleeding mallory weiss tear s/p epi injection and cautery, large non bleeding esophageal varices, portal HTN gastropathy 7/21 Abd u/s >> gallstones, normal CBD, ascites, Rt pleural effusion  SUBJECTIVE/INTERVAL:  Improved agitation Less distended abdomen  VITAL SIGNS: Temp:  [98.4 F (36.9 C)-100.6 F (38.1 C)] 99.5 F (37.5 C) (07/23 0700) Pulse Rate:  [93-125] 125 (07/23 0700) Resp:  [15-23] 18 (07/23 0700) BP: (127-168)/(70-98) 159/93 mmHg (07/23 0700) SpO2:  [88 %-100 %] 99 % (07/23 0500) Weight:  [70.3 kg (154 lb 15.7 oz)] 70.3 kg (154 lb 15.7 oz) (07/23 0500)   INTAKE / OUTPUT: Intake/Output     07/22 0701 - 07/23 0700 07/23 0701 - 07/24 0700   I.V. (mL/kg) 2491.2 (35.4) 100 (1.4)   Other 100    IV Piggyback 1150    Total Intake(mL/kg) 3741.2 (53.2) 100 (1.4)   Urine (mL/kg/hr) 5575 (3.3) 750 (7.2)   Total Output 5575 750   Net -1833.8 -650         PHYSICAL EXAMINATION: General:  ill appearing male Neuro:  L pupil 4mm - sluggish, R pupil 2mm reactive, follows simple commands, MAEs HEENT:  MMM Cardiovascular: regular, no murmurs Lungs:  CTA Abdomen:  Less distended Musculoskeletal:  No deformity or edema Skin:  Scar noted on L leg anteriorly  LABS:  CBC  Recent Labs Lab 07/05/14 0625 07/06/14 0333 07/07/14 0343  WBC 2.7* 2.5* 1.7*  HGB 8.9* 8.1* 8.8*  HCT 26.2*  24.3* 26.9*  PLT PLATELET CLUMPS NOTED ON SMEAR, COUNT APPEARS DECREASED 65* 64*   Coag's  Recent Labs Lab 07/04/14 0350  INR 2.16*   BMET  Recent Labs Lab 07/06/14 0333 07/06/14 1608 07/07/14 0343  NA 137 137 136*  K 3.1* 3.2* 3.5*  CL 101 102 99  CO2 25 28 29   BUN 13 8 6   CREATININE 0.56 0.57 0.51  GLUCOSE 117* 110* 99   Electrolytes  Recent Labs Lab 07/04/14 0016  07/04/14 1450  07/06/14 0333 07/06/14 1608 07/07/14 0343  CALCIUM 8.6  --   --   < > 7.4* 7.3* 7.5*  MG  --   < > 1.3*  --  1.4* 1.7 1.4*  PHOS  --   --  2.9  --   --   --  2.9  < > = values in this interval not displayed. ABG  Recent Labs Lab 07/04/14 0523 07/04/14 1208 07/05/14 0337  PHART 7.273* 7.423 7.455*  PCO2ART 40.7 35.1 37.6  PO2ART 203.0* 63.8* 74.6*   Liver Enzymes  Recent Labs Lab 07/04/14 0016 07/05/14 0235  AST 51* 57*  ALT 15 14  ALKPHOS 98 67  BILITOT 1.4* 1.4*  ALBUMIN 2.9* 2.3*   Imaging US Abdomen Complete  07/05/2014   CLINICAL DATA:  Portal hypertension, cirrhosis  EXAM: ULTRASOUND ABDOMEN COMPLETE  COMPARISON:  01/07/2013  FINDINGS: Gallbladder:  Gallstones are noted within gallbladder the largest measures 3.6 mm. There is borderline thickening of gallbladder  wall up to 3.1 mm. Perihepatic ascites is noted. No sonographic Murphy's sign.  Common bile duct:  Diameter: 3 mm in diameter within normal limits.  Liver:  Again noted nodular contour of the liver with diffuse increased echogenicity consistent with cirrhosis. No focal hepatic mass.  IVC:  No abnormality visualized.  Pancreas:  Visualized portion unremarkable.  Spleen:  Size and appearance within normal limits.  Measures 11 cm in length.  Right Kidney:  Length: 11 cm length. Echogenicity within normal limits. No mass or hydronephrosis visualized.  Left Kidney:  Length: 11 cm length. Echogenicity within normal limits. No mass or hydronephrosis visualized.  Abdominal aorta:  No aneurysm visualized.  Measures up to  2.2 cm in diameter.  Other findings:  Abdominal ascites is noted.  There is right pleural effusion.  IMPRESSION: 1. Gallstones are noted within gallbladder. The largest measures 3.6 mm. Borderline thickening of gallbladder wall without sonographic Murphy's sign. 2. Normal CBD. There is again noted cirrhotic liver without focal hepatic mass. 3. No hydronephrosis. 4. Abdominal ascites.  Right pleural effusion.   Electronically Signed   By: Natasha Mead M.D.   On: 07/05/2014 15:22   Dg Abd Portable 1v  07/06/2014   CLINICAL DATA:  Abdominal pain and distention.  Rule out ileus.  EXAM: PORTABLE ABDOMEN - 1 VIEW  COMPARISON:  07/04/2014  FINDINGS: There is new, mild gaseous distension of small and large bowel loops. Small bowel loops measure up to approximately 3 cm in diameter. There is no gross intraperitoneal free air. Enteric tube has been removed. Moderate lumbar spondylosis is noted.  IMPRESSION: New, mild gaseous distension of small and large bowel, which may reflect mild ileus.   Electronically Signed   By: Sebastian Ache   On: 07/06/2014 09:04    ASSESSMENT / PLAN:  PULMONARY ETT 7/19 >>> 7/21  A: Acute respiratory failure secondary to hypovolemic shock >> improved. P:   Pulm hygiene  Keep O2 sat >92% PRN BD's  CARDIOVASCULAR A: Tachycardia - related to withdrawal. Hypovolemic shock - resolved Hypertensive 7/23 P:   Goal Map >65 Begin metoprolol PO Would consider Clonidine for hypertension and added withdrawal assistance if hypertension persists  RENAL A: Hypokalemia. Hypomagnesemia. P:   F/u and replace electrolytes as needed  GASTROINTESTINAL A: Acute GI Bleed 2nd to [employer] tear. Alcoholic cirrhosis with esophageal varices, ascites, and portal gastropathy. Ileus developed 7/22 -  Less distended 7/23 Nutrition P:   Clear liquids ADAT to low sodium per GI Hold laxatives per GI Change protonix to IV Q12h  HEMATOLOGIC A: Acute blood loss anemia 2nd to Upper GI  bleeding >> Hb stable. Thrombocytopenia 2nd to ETOH >> PLT stable. Leukopenia. P: F/u CBC SCD for DVT prevention  INFECTIOUS A: Post EGD prophylaxis P: Cipro started 7/19 >>> 7/26 (7 day course)  ENDOCRINE A: Hyperglycemia - improving. P:   F/u glucose on BMET  NEUROLOGIC A: History of ETOH abuse. P:    Start Nicotine patch PT/OT eval CIWA for monitoring Ativan prn for CIWA > 8 Thiamine and folate given IV   Summary: Respiratory status remains stable. Mild illeus 7/22, abdomen is less distended today but still no BM. Spoke with Dr. Bosie Clos of GI who is okay with starting clear liquid diet and advancing as tolerated to low-sodium. Patient with agitation yesterday but appears calm and oriented this morning, has been off Precedex since 7/22 at 1700. Will need to continue monitoring for withdrawal and continue CIWA and prn ativan. Will be candidate  for SDU status 7/24 if remains off precedex.  Elsie AmisJessica Koch ACNP student  Reviewed above, examined, and agree.  Mental status improved, and now off precedex.  Abdominal exam improved, and tolerating diet.  No evidence of continued bleeding.  Hx of smoking >> add prn BD's for COPD.  Change to SDU status.  Will ask [facility] to assume care from 7/24 and PCCM sign off.  Coralyn HellingVineet Satina Jerrell, MD [facility] 07/07/2014, 10:57 AM Pager:  [phone] After 3pm call: [phone]

## 2014-07-07 NOTE — Progress Notes (Signed)
RN sitting outside patients room, heard some noise and went inside room just in time to see patient fall out of chair onto floor.  Two RNs assisted patient back into bed, patient denied hitting head or any injury.  Patient stated he was wanting to get out of chair and felt like he could make it back to the bed on his own.  Notified MD.

## 2014-07-08 LAB — BASIC METABOLIC PANEL
ANION GAP: 10 (ref 5–15)
Anion gap: 12 (ref 5–15)
BUN: 4 mg/dL — ABNORMAL LOW (ref 6–23)
BUN: 6 mg/dL (ref 6–23)
CHLORIDE: 88 meq/L — AB (ref 96–112)
CO2: 30 mEq/L (ref 19–32)
CO2: 29 mEq/L (ref 19–32)
Calcium: 7.8 mg/dL — ABNORMAL LOW (ref 8.4–10.5)
Calcium: 7.9 mg/dL — ABNORMAL LOW (ref 8.4–10.5)
Chloride: 88 mEq/L — ABNORMAL LOW (ref 96–112)
Creatinine, Ser: 0.5 mg/dL (ref 0.50–1.35)
Creatinine, Ser: 0.58 mg/dL (ref 0.50–1.35)
GFR calc Af Amer: >90 mL/min (ref >90)
GFR calc non Af Amer: >90 mL/min (ref >90)
GLUCOSE: 120 mg/dL — AB (ref 70–99)
Glucose, Bld: 97 mg/dL (ref 70–99)
POTASSIUM: 2.5 meq/L — AB (ref 3.7–5.3)
POTASSIUM: 3.3 meq/L — AB (ref 3.7–5.3)
Sodium: 130 mEq/L — ABNORMAL LOW (ref 137–147)
Sodium: 127 mEq/L — ABNORMAL LOW (ref 137–147)

## 2014-07-08 LAB — TYPE AND SCREEN
ABO/RH(D): A POS
ANTIBODY SCREEN: NEGATIVE
UNIT DIVISION: 0
UNIT DIVISION: 0
Unit division: 0

## 2014-07-08 LAB — CBC
HCT: 27.5 % — ABNORMAL LOW (ref 39.0–52.0)
Hemoglobin: 9.1 g/dL — ABNORMAL LOW (ref 13.0–17.0)
MCH: 26.9 pg (ref 26.0–34.0)
MCHC: 33.1 g/dL (ref 30.0–36.0)
MCV: 81.4 fL (ref 78.0–100.0)
PLATELETS: 75 10*3/uL — AB (ref 150–400)
RBC: 3.38 MIL/uL — ABNORMAL LOW (ref 4.22–5.81)
RDW: 16.1 % — AB (ref 11.5–15.5)
WBC: 2.1 10*3/uL — AB (ref 4.0–10.5)

## 2014-07-08 LAB — PHOSPHORUS: Phosphorus: 3 mg/dL (ref 2.3–4.6)

## 2014-07-08 LAB — MAGNESIUM: MAGNESIUM: 1.5 mg/dL (ref 1.5–2.5)

## 2014-07-08 MED ORDER — POTASSIUM CHLORIDE CRYS ER 20 MEQ PO TBCR
40.0000 meq | EXTENDED_RELEASE_TABLET | ORAL | Status: AC
  Administered 2014-07-08 (×3): 40 meq via ORAL
  Filled 2014-07-08 (×3): qty 2

## 2014-07-08 MED ORDER — ACETAMINOPHEN 325 MG PO TABS
650.0000 mg | ORAL_TABLET | Freq: Four times a day (QID) | ORAL | Status: DC | PRN
  Administered 2014-07-08 – 2014-07-09 (×2): 650 mg via ORAL
  Filled 2014-07-08 (×2): qty 2

## 2014-07-08 MED ORDER — ENSURE COMPLETE PO LIQD
237.0000 mL | Freq: Three times a day (TID) | ORAL | Status: DC
  Administered 2014-07-08 – 2014-07-10 (×6): 237 mL via ORAL

## 2014-07-08 NOTE — Evaluation (Signed)
Physical Therapy Evaluation Patient Details Name: [name] MRN: [id number] DOB: 06-Jul-1946 Today's Date: 07/08/2014   History of Present Illness  68 -year-old male with PMHx of presumed liver cirrhosis likely from alcohol abuse, previous history of ascites requiring large-volume paracentesis, GERD, COPD, hypertension admitted 07/03/2014 with acute hemorrhagic shock and upper GI bleed  Clinical Impression  Pt admitted with above. Pt currently with functional limitations due to the deficits listed below (see PT Problem List).  Pt will benefit from skilled PT to increase their independence and safety with mobility to allow discharge to the venue listed below.  Pt currently at least min assist for mobility due to weakness and unsteadiness.  Recommend ST-SNF to assist pt with returning back to baseline mobility prior to home with 59 year old son.     Follow Up Recommendations SNF;Supervision for mobility/OOB    Equipment Recommendations  None recommended by PT    Recommendations for Other Services       Precautions / Restrictions Precautions Precautions: Fall      Mobility  Bed Mobility Overal bed mobility: Needs Assistance Bed Mobility: Supine to Sit;Sit to Supine     Supine to sit: Supervision Sit to supine: Supervision   General bed mobility comments: increased time and effort  Transfers Overall transfer level: Needs assistance Equipment used: Rolling walker (2 wheeled) Transfers: Sit to/from Stand Sit to Stand: Min assist         General transfer comment: verbal cues for technique, increased assist to rise and control descent as well as steadying  Ambulation/Gait Ambulation/Gait assistance: Min assist Ambulation Distance (Feet): 100 Feet Assistive device: Rolling walker (2 wheeled) Gait Pattern/deviations: Step-through pattern;Trunk flexed Gait velocity: decr   General Gait Details: max verbal cues for safe RW positioning, cues for taking time and controlling  movement with improvement in gait and less assist required, min assist to steady  Stairs            Wheelchair Mobility    Modified Rankin (Stroke Patients Only)       Balance                                             Pertinent Vitals/Pain No pain reported, activity to tolerance    Home Living Family/patient expects to be discharged to:: Private residence Living Arrangements: Children   Type of Home: House Home Access: Stairs to enter;Ramped entrance   Entrance Stairs-Number of Steps: 2 Home Layout: One level Home Equipment: Environmental consultant - 2 wheels;Cane - single point      Prior Function Level of Independence: Independent with assistive device(s)               Hand Dominance        Extremity/Trunk Assessment               Lower Extremity Assessment: Generalized weakness         Communication   Communication: No difficulties  Cognition Arousal/Alertness: Awake/alert Behavior During Therapy: WFL for tasks assessed/performed Overall Cognitive Status: Within Functional Limits for tasks assessed                      General Comments      Exercises        Assessment/Plan    PT Assessment Patient needs continued PT services  PT Diagnosis Difficulty walking;Generalized weakness  PT Problem List Decreased strength;Decreased activity tolerance;Decreased balance;Decreased mobility;Decreased knowledge of use of DME  PT Treatment Interventions Gait training;DME instruction;Functional mobility training;Therapeutic activities;Therapeutic exercise;Patient/family education;Balance training   PT Goals (Current goals can be found in the Care Plan section) Acute Rehab PT Goals PT Goal Formulation: With patient Time For Goal Achievement: 07/15/14 Potential to Achieve Goals: Good    Frequency Min 3X/week   Barriers to discharge        Co-evaluation               End of Session Equipment Utilized During  Treatment: Gait belt Activity Tolerance: Patient limited by fatigue Patient left: in bed;with call bell/phone within reach;with nursing/sitter in room;with bed alarm set           Time: 1350-1407 PT Time Calculation (min): 17 min   Charges:   PT Evaluation $Initial PT Evaluation Tier I: 1 Procedure PT Treatments $Gait Training: 8-22 mins   PT G Codes:          Alita Waldren,[name] 07/08/2014, 2:30 PM Zenovia JarredKati Jin Capote, PT, DPT 07/08/2014 Pager: [phone]

## 2014-07-08 NOTE — Progress Notes (Signed)
NUTRITION FOLLOW UP/CONSULT FOR ASSESSMENT  Intervention:   - Ensure Complete TID - Encouraged increased meal intake, offered to help pt order a meal/snack however not interested - Recommend MD consider appetite stimulant as pt with poor appetite x 1 month PTA  - RD to monitor plan of care   Nutrition Dx:   Inadequate oral intake related to inability to eat as evidenced by NPO - ongoing but related to poor appetite as evidenced by pt report  Goal:   Diet advancement - met   New goal: Pt to consume >90% of meals/supplements  Monitor:   Weights, labs, intake  Assessment:   Pt with hx of COPD, acid reflux, IBS, collapsed lung, arthritis, chronic back pain, alcohol abuse, and presumed cirrhosis presenting with GI bleeding and vomiting bright red blood starting yesterday evening. Due to patient's hemodynamic instability and active bleeding, it was felt that he needed to be intubated for airway protection. Stool was brown and heme negative in ER. Had abdominal discomfort 3 days PTA. Per H&P, pt reported drinking 21 oz of alcohol per week.   7/20: - Had EGD today that showed bleeding Mallory-Weiss tear and large nonbleeding esophageal varices and portal hypertensive gastropathy  - Had ultrasound of abdomen with only trace amount of ascites seen, not suitable for diagnostic paracentesis  - Per telephone conversation with son, pt has been eating very poorly for the past month - 1-2 crackers/day with either strawberry jam or peanut butter. Son reports in the past 2 weeks pt has been consuming 4-6 beers/day, down from 8-16 per day before then. He reports pt's weight has been stable. States pt sometimes drinks 1 glass of chocolate milk. Used to drink Ensure in the past, none recently.  - Per conversation with RN, GI does not want pt to have OGT or NGT placed  - Wearing safety mittens  7/21: - Extubated today - Alone and asleep in room - Per NP, plan is to keep NPO today and advance diet tomorrow  per GI - Magnesium being monitored and replaced - Continues on CIWA protocol  - Getting thiamine, folic acid, magnesium, and potassium   7/24: - Diet advanced to 2g sodium yesterday afternoon - Met with pt who c/o no appetite, didn't eat anything yesterday or today - Agreeable to getting Ensure Complete later on the day, didn't want any now  - Potassium low, magnesium and phosphorus WNL   Potassium  Date/Time Value Ref Range Status  07/08/2014  3:45 AM 2.5* 3.7 - 5.3 mEq/L Final     CRITICAL RESULT CALLED TO, READ BACK BY AND VERIFIED WITH:     BARHAM,M/2W $RemoveBefo'@[id number]'[id number]$  ON 07/08/14 BY KARCZEWSKI,S.  07/07/2014  3:48 PM 3.0* 3.7 - 5.3 mEq/L Final  07/07/2014  3:43 AM 3.5* 3.7 - 5.3 mEq/L Final    Phosphorus  Date/Time Value Ref Range Status  07/08/2014  3:45 AM 3.0  2.3 - 4.6 mg/dL Final  07/07/2014  3:43 AM 2.9  2.3 - 4.6 mg/dL Final  07/04/2014  2:50 PM 2.9  2.3 - 4.6 mg/dL Final    Magnesium  Date/Time Value Ref Range Status  07/08/2014  3:45 AM 1.5  1.5 - 2.5 mg/dL Final  07/07/2014  3:48 PM 2.1  1.5 - 2.5 mg/dL Final  07/07/2014  3:43 AM 1.4* 1.5 - 2.5 mg/dL Final     Height: Ht Readings from Last 1 Encounters:  07/04/14 $RemoveB'5\' 8"'bjFjBHDo$  (1.727 m)    Weight Status:   Wt Readings from Last 1 Encounters:  07/08/14 149 lb 14.6 oz (68 kg)  Admit wt         130 lb (58.9 kg)   Re-estimated needs:  Kcal: 1800-2000 Protein: 90-110g Fluid: per MD  Skin: Intact   Diet Order: Sodium Restricted   Intake/Output Summary (Last 24 hours) at 07/08/14 1312 Last data filed at 07/08/14 1000  Gross per 24 hour  Intake    730 ml  Output   2750 ml  Net  -2020 ml    Last BM: PTA   Labs:   Recent Labs Lab 07/04/14 1450  07/07/14 0343 07/07/14 1548 07/08/14 0345  NA  --   < > 136* 132* 130*  K  --   < > 3.5* 3.0* 2.5*  CL  --   < > 99 92* 88*  CO2  --   < > $R'29 29 30  'jC$ BUN  --   < > 6 6 4*  CREATININE  --   < > 0.51 0.52 0.50  CALCIUM  --   < > 7.5* 7.9* 7.8*  MG 1.3*  < > 1.4* 2.1 1.5   PHOS 2.9  --  2.9  --  3.0  GLUCOSE  --   < > 99 141* 120*  < > = values in this interval not displayed.  CBG (last 3)  No results found for this basename: GLUCAP,  in the last 72 hours  Scheduled Meds: . antiseptic oral rinse  15 mL Mouth Rinse q12n4p  . chlorhexidine  15 mL Mouth Rinse BID  . ciprofloxacin  400 mg Intravenous Q12H  . folic acid  1 mg Oral Daily  . metoprolol tartrate  25 mg Oral BID  . nicotine  21 mg Transdermal Daily  . pantoprazole (PROTONIX) IV  40 mg Intravenous Q12H  . potassium chloride  40 mEq Oral Q4H  . thiamine  100 mg Oral Daily    Continuous Infusions: . sodium chloride 10 mL/hr at 07/07/14 [address] [place], [place], [place] [phone] Pager [phone] Weekend/After Hours Pager

## 2014-07-08 NOTE — Evaluation (Signed)
Occupational Therapy Evaluation Patient Details Name: [name] MRN: [id number] DOB: 04/04/1946 Today's Date: 07/08/2014    History of Present Illness 68 -year-old male with PMHx of presumed liver cirrhosis likely from alcohol abuse, previous history of ascites requiring large-volume paracentesis, GERD, COPD, hypertension admitted 07/03/2014 with acute hemorrhagic shock and upper GI bleed  Pt was extubated on 7/21   Clinical Impression   Pt was admitted for the above.  He has decreased safety and balance which impair ADLs.  Pt reports that he had some falls in the past at home but reports he was independent with ADLs.  Pt now requires overall min A.  He would benefit from skilled OT to increase safety and independence.  Goals in acute are for overall min guard.    Follow Up Recommendations  SNF;Supervision/Assistance - 24 hour    Equipment Recommendations  3 in 1 bedside comode    Recommendations for Other Services       Precautions / Restrictions Precautions Precautions: Fall Restrictions Weight Bearing Restrictions: No      Mobility Bed Mobility Overal bed mobility: Needs Assistance Bed Mobility: Supine to Sit;Sit to Supine     Supine to sit: Supervision Sit to supine: Supervision   General bed mobility comments: increased time and effort  Transfers Overall transfer level: Needs assistance Equipment used: Rolling walker (2 wheeled) Transfers: Sit to/from Stand Sit to Stand: Min assist         General transfer comment: assist to stand and steadying assistance    Balance                                            ADL Overall ADL's : Needs assistance/impaired     Grooming: Set up;Sitting   Upper Body Bathing: Set up;Sitting   Lower Body Bathing: Minimal assistance;Sit to/from stand   Upper Body Dressing : Set up;Sitting   Lower Body Dressing: Minimal assistance;Sit to/from stand                 General ADL Comments: Stood  and sidestepped up HOB.  Pt with decreased balance; high falls risk.      Vision                     Perception     Praxis      Pertinent Vitals/Pain Pt reports chronic back pain.  Not rated.  Repositioned in bed     Hand Dominance     Extremity/Trunk Assessment Upper Extremity Assessment Upper Extremity Assessment: Overall WFL for tasks assessed   Lower Extremity Assessment Lower Extremity Assessment: Generalized weakness       Communication Communication Communication: No difficulties   Cognition Arousal/Alertness: Awake/alert Behavior During Therapy: WFL for tasks assessed/performed Overall Cognitive Status: No family/caregiver present to determine baseline cognitive functioning (pt tangential when questions asked; decreased safety)                     General Comments       Exercises       Shoulder Instructions      Home Living Family/patient expects to be discharged to:: Private residence Living Arrangements: Children   Type of Home: House Home Access: Stairs to enter;Ramped entrance Entrance Stairs-Number of Steps: 2   Home Layout: One level     Bathroom Shower/Tub: Tub/shower unit Shower/tub characteristics: [employer] treasurerCurtain Bathroom  Toilet: Standard     Home Equipment: Walker - 2 wheels;Cane - single point   Additional Comments: uses door to get up from toilet ; sits at bottom of tub; has fallen in shower      Prior Functioning/Environment Level of Independence: Independent with assistive device(s) (made himself a cane; has mother's walker--hasn't used.  )             OT Diagnosis: Generalized weakness   OT Problem List: Decreased strength;Decreased activity tolerance;Impaired balance (sitting and/or standing);Decreased safety awareness;Pain   OT Treatment/Interventions: Self-care/ADL training;DME and/or AE instruction;Patient/family education;Balance training;Cognitive remediation/compensation    OT Goals(Current goals can be  found in the care plan section) Acute Rehab OT Goals Patient Stated Goal: none stated OT Goal Formulation: With patient Time For Goal Achievement: 07/22/14 Potential to Achieve Goals: Good ADL Goals Pt Will Perform Grooming: with min guard assist;standing Pt Will Perform Lower Body Bathing: with min guard assist;sit to/from stand Pt Will Perform Lower Body Dressing: with min guard assist;sit to/from stand Pt Will Transfer to Toilet: with min guard assist;ambulating;bedside commode Pt Will Perform Toileting - Clothing Manipulation and hygiene: with min guard assist;sit to/from stand Additional ADL Goal #1: Pt will get balance prior to managing clothes wtihout cues and keep one hand on walker without cues for improved safety during adls  OT Frequency: Min 2X/week   Barriers to D/C:            Co-evaluation              End of Session    Activity Tolerance: Patient tolerated treatment well Patient left: in bed;with call bell/phone within reach;with bed alarm set   Time: 1434-1453 OT Time Calculation (min): 19 min Charges:  OT General Charges $OT Visit: 1 Procedure OT Evaluation $Initial OT Evaluation Tier I: 1 Procedure OT Treatments $Self Care/Home Management : 8-22 mins G-Codes:    [name] 08/01/14, 3:33 PM  [name], OTR/L [phone] 08-01-14

## 2014-07-08 NOTE — Progress Notes (Signed)
Attempted to give report to 4th floor RN. RN currently with patient but advised will call me back.

## 2014-07-08 NOTE — Progress Notes (Signed)
PT Cancellation Note  Patient Details Name: [name] MRN: [id number] DOB: 03/23/1946   Cancelled Treatment:    Reason Eval/Treat Not Completed: Other (comment) (pt about to transfer floors, will attempt to see after transfer)   [name],[name] 07/08/2014, 11:09 AM

## 2014-07-08 NOTE — Progress Notes (Signed)
[facility] FOR [facility] LAB REPLACEMENT ONLY  The patient does [facility] the [facility] based on the criteria listed below:   1. Is GFR >/= 40 ml/min? Yes.    Patient's GFR today is >90 2. Is urine output >/= 0.5 ml/kg/hr for the last 6 hours? Yes.   Patient's UOP is 2.35 ml/kg/hr 3. Is BUN < 60 mg/dL? Yes.    Patient's BUN today is 4 4. Abnormal electrolyte(s): Potassium 5. Ordered repletion with: Potassium per protocol.  [name], [name] P 07/08/2014 4:40 AM

## 2014-07-08 NOTE — Progress Notes (Signed)
CRITICAL VALUE ALERT  Critical value received:  K+ 2.5  Date of notification:  07/08/14  Time of notification:  0430  Critical value read back:Yes.    Nurse who received alert:  Max SaneMackenzie Kerby Borner, RN  MD notified (1st page): [facility]  Time of first page: 0430

## 2014-07-08 NOTE — Progress Notes (Signed)
Report given to Amil AmenJulia, RN on 4th floor. Patient in no acute distress, no complaints of pain, and vital signs stable.

## 2014-07-08 NOTE — Progress Notes (Signed)
Patient ID: [name], male   DOB: [birth year], 68 y.o.   MRN: [id number] [facility] PROGRESS NOTE  KIMBALL APPLEBY WUJ:[id number] DOB: 1946/10/21 DOA: 07/03/2014 PCP: No primary provider on file.  Brief narrative: 63 -year-old male with past medical history of presumed liver cirrhosis likely from alcohol abuse, previous history of ascites requiring large-volume paracentesis, GERD, COPD, hypertension who presented to [facility] ED 07/03/2014 with several day history of abdominal discomfort and acute onset hematemesis started 4-5 hours prior to this admission. Patient was found to be hypotensive, tachycardic with significant anemia concerning for major GI bleed. Patient was intubated for urgent EGD. Bedside EGD done by Dr. Bosie Clos showed bleeding likely secondary to Mallory-Weiss tear. Further, patient was found to have mild ileus. Care transitioned to [facility] starting 07/08/2104.  Assessment/Plan:  Principal Problem: Acute hemorrhagic shock / Upper GI bleed secondary to [employer] tear  Patient was hypotensive, tachycardiac with severe anemia on the admission. Patient underwent emergent EGD with findings of bleeding from Mallory-Weiss tear. Patient is status post epi injection and cautery. Also seen on EGD was large nonbleeding esophageal varices, portal hypertension and gastropathy. Patient required octreotide drip on admission and PPI therapy.  Patient is hemodynamically stable at this time.   Patient is medically stable to be transferred to telemetry floor.  Continue Protonix 40 mg IV every 12.  Continue ciprofloxacin 400 mg IV every 12 hours and, started 07/07/2014.  Active problems: History of alcohol abuse / alcoholic liver cirrhosis with esophageal varices, ascites and portal gastropathy  Alcohol level and UDS were not obtained at the time of the admission.  Continue CIWA protocol. Continue folic acid, thiamine. May decrease IV fluids. Patient is tolerating PO intake.  Acute  respiratory failure secondary to hypovolemic shock  Patient was intubated for emergent EGD. He was intubated until 07/05/2014.  Respiratory status remains stable.  Continue duo neb every 2 hours as needed for shortness of breath or wheezing. Hypertension  Continue metoprolol 25 mg by mouth twice a day. Hypokalemia  Secondary to GI losses. Being repleted. Followup BMP in the morning. Ileus  Pt developed ileus 07/06/2014.   Diet as tolerated. Pancytopenia / anemia of chronic disease and acute blood loss anemia  / thrombocytopenia / leukopenia  Likely secondary to bone marrow suppression from history of alcohol abuse  CBC stable with white blood cell count of 2.1, hemoglobin 9.1 and platelet count 75 Mild protein calorie malnutrition  Nutrition consulted H/O smoking  Nicotine patch ordered.  DVT prophylaxis: SCD's bilaterally while pt is in hospital due to risk of bleed.   Code Status: full code  Family Communication: plan of care discussed with the patient Disposition Plan: home when stable   Manson Passey, MD  [facility] Pager [phone]  If 7PM-7AM, please contact night-coverage [email or web address] Password [facility] 07/08/2014, 10:43 AM   LOS: 5 days   Consultants:  Gastroenterology (Dr. Estrella Deeds, [place])  Procedures:  7/20 EGD >> bleeding mallory weiss tear s/p epi injection and cautery, large non bleeding esophageal varices, portal HTN, gastropathy   7/21 Abd u/s >> gallstones, normal CBD, ascites, Rt pleural effusion  Antibiotics:  Cipro 07/07/2014 -->  HPI/Subjective: No acute overnight events.  Objective: Filed Vitals:   07/08/14 0600 07/08/14 0700 07/08/14 0800 07/08/14 0900  BP: 144/77 144/76 157/79 162/96  Pulse: 102 100  115  Temp:   97.5 F (36.4 C)   TempSrc:   Oral   Resp: 15 13 22 22   Height:      Weight:  SpO2:   99% 93%    Intake/Output Summary (Last 24 hours) at 07/08/14 1043 Last data filed at 07/08/14 0900  Gross per 24 hour   Intake 938.83 ml  Output   2750 ml  Net -1811.17 ml    Exam:   General:  Pt is alert, follows commands appropriately, not in acute distress  Cardiovascular: Regular rate and rhythm, S1/S2, no murmurs  Respiratory: Clear to auscultation bilaterally, no wheezing  Abdomen: Soft, non tender, non distended, bowel sounds present  Extremities: No edema, pulses DP and PT palpable bilaterally  Neuro: Grossly nonfocal  Data Reviewed: Basic Metabolic Panel:  Recent Labs Lab 07/04/14 0016  07/04/14 1450  07/06/14 0333 07/06/14 1608 07/07/14 0343 07/07/14 1548 07/08/14 0345  NA 138  --   --   < > 137 137 136* 132* 130*  K 3.9  --   --   < > 3.1* 3.2* 3.5* 3.0* 2.5*  CL 90*  --   --   < > 101 102 99 92* 88*  CO2 28  --   --   < > 25 28 29 29 30   GLUCOSE 110*  --   --   < > 117* 110* 99 141* 120*  BUN 19  --   --   < > 13 8 6 6  4*  CREATININE 0.63  --   --   < > 0.56 0.57 0.51 0.52 0.50  CALCIUM 8.6  --   --   < > 7.4* 7.3* 7.5* 7.9* 7.8*  MG  --   < > 1.3*  --  1.4* 1.7 1.4* 2.1 1.5  PHOS  --   --  2.9  --   --   --  2.9  --  3.0  < > = values in this interval not displayed. Liver Function Tests:  Recent Labs Lab 07/04/14 0016 07/05/14 0235  AST 51* 57*  ALT 15 14  ALKPHOS 98 67  BILITOT 1.4* 1.4*  PROT 6.5 5.4*  ALBUMIN 2.9* 2.3*   No results found for this basename: LIPASE, AMYLASE,  in the last 168 hours No results found for this basename: AMMONIA,  in the last 168 hours CBC:  Recent Labs Lab 07/05/14 0235 07/05/14 0625 07/06/14 0333 07/07/14 0343 07/08/14 0345  WBC 2.0* 2.7* 2.5* 1.7* 2.1*  HGB 8.3* 8.9* 8.1* 8.8* 9.1*  HCT 25.1* 26.2* 24.3* 26.9* 27.5*  MCV 82.0 80.1 80.7 82.8 81.4  PLT 52* PLATELET CLUMPS NOTED ON SMEAR, COUNT APPEARS DECREASED 65* 64* 75*   Cardiac Enzymes: No results found for this basename: CKTOTAL, CKMB, CKMBINDEX, TROPONINI,  in the last 168 hours BNP: No components found with this basename: POCBNP,  CBG: No results found  for this basename: GLUCAP,  in the last 168 hours  Recent Results (from the past 240 hour(s))  MRSA PCR SCREENING     Status: None   Collection Time    07/04/14  6:46 AM      Result Value Ref Range Status   MRSA by PCR NEGATIVE  NEGATIVE Final     Studies: No results found.  Scheduled Meds: . ciprofloxacin  400 mg Intravenous Q12H  . folic acid  1 mg Oral Daily  . metoprolol tartrate  25 mg Oral BID  . nicotine  21 mg Transdermal Daily  . pantoprazole   40 mg Intravenous Q12H  . potassium chloride  40 mEq Oral Q4H  . thiamine  100 mg Oral Daily   Continuous Infusions: .  sodium chloride 10 mL/hr at 07/07/14 1107

## 2014-07-09 DIAGNOSIS — R578 Other shock: Secondary | ICD-10-CM | POA: Diagnosis present

## 2014-07-09 DIAGNOSIS — K226 Gastro-esophageal laceration-hemorrhage syndrome: Secondary | ICD-10-CM | POA: Diagnosis present

## 2014-07-09 DIAGNOSIS — E876 Hypokalemia: Secondary | ICD-10-CM | POA: Diagnosis present

## 2014-07-09 DIAGNOSIS — I10 Essential (primary) hypertension: Secondary | ICD-10-CM | POA: Diagnosis present

## 2014-07-09 DIAGNOSIS — D61818 Other pancytopenia: Secondary | ICD-10-CM | POA: Diagnosis present

## 2014-07-09 DIAGNOSIS — D638 Anemia in other chronic diseases classified elsewhere: Secondary | ICD-10-CM | POA: Diagnosis present

## 2014-07-09 LAB — CBC
HCT: 28.2 % — ABNORMAL LOW (ref 39.0–52.0)
HEMOGLOBIN: 9.4 g/dL — AB (ref 13.0–17.0)
MCH: 27.2 pg (ref 26.0–34.0)
MCHC: 33.3 g/dL (ref 30.0–36.0)
MCV: 81.7 fL (ref 78.0–100.0)
PLATELETS: 98 10*3/uL — AB (ref 150–400)
RBC: 3.45 MIL/uL — AB (ref 4.22–5.81)
RDW: 16.3 % — ABNORMAL HIGH (ref 11.5–15.5)
WBC: 2.4 10*3/uL — ABNORMAL LOW (ref 4.0–10.5)

## 2014-07-09 LAB — BASIC METABOLIC PANEL
ANION GAP: 12 (ref 5–15)
BUN: 6 mg/dL (ref 6–23)
CO2: 29 meq/L (ref 19–32)
Calcium: 8.1 mg/dL — ABNORMAL LOW (ref 8.4–10.5)
Chloride: 88 mEq/L — ABNORMAL LOW (ref 96–112)
Creatinine, Ser: 0.54 mg/dL (ref 0.50–1.35)
GFR calc Af Amer: >90 mL/min (ref >90)
GFR calc non Af Amer: >90 mL/min (ref >90)
GLUCOSE: 102 mg/dL — AB (ref 70–99)
Potassium: 3.4 mEq/L — ABNORMAL LOW (ref 3.7–5.3)
SODIUM: 129 meq/L — AB (ref 137–147)

## 2014-07-09 MED ORDER — MEGESTROL ACETATE 400 MG/10ML PO SUSP
200.0000 mg | Freq: Two times a day (BID) | ORAL | Status: DC
  Administered 2014-07-09 – 2014-07-10 (×3): 200 mg via ORAL
  Filled 2014-07-09 (×4): qty 5

## 2014-07-09 MED ORDER — POTASSIUM CHLORIDE CRYS ER 20 MEQ PO TBCR
40.0000 meq | EXTENDED_RELEASE_TABLET | Freq: Once | ORAL | Status: AC
  Administered 2014-07-09: 40 meq via ORAL
  Filled 2014-07-09: qty 2

## 2014-07-09 NOTE — Progress Notes (Addendum)
Patient ID: [name], male   DOB: Apr 27, 1946, 68 y.o.   MRN: [id number] [facility] PROGRESS NOTE  YERAY TOMAS WUJ:[id number] DOB: 05/17/1946 DOA: 07/03/2014 PCP: No primary provider on file.  Brief narrative: 76 -year-old male with past medical history of presumed liver cirrhosis likely from alcohol abuse, previous history of ascites requiring large-volume paracentesis, GERD, COPD, hypertension who presented to [facility] ED 07/03/2014 with several day history of abdominal discomfort and acute onset hematemesis started 4-5 hours prior to this admission. Patient was found to be hypotensive, tachycardic with significant anemia concerning for major GI bleed. Patient was intubated for urgent EGD. Bedside EGD done by Dr. Bosie Clos showed bleeding likely secondary to Mallory-Weiss tear. Further, patient was found to have mild ileus. Care transitioned to [facility] starting 07/08/2104.   Assessment/Plan:   Principal Problem:  Acute hemorrhagic shock / Upper GI bleed secondary to [employer] tear  Patient was hypotensive, tachycardiac with severe anemia on the admission. Patient underwent emergent EGD with findings of bleeding from Mallory-Weiss tear. Patient is status post epi injection and cautery. Also seen on EGD was large nonbleeding esophageal varices, portal hypertension and gastropathy. Patient required octreotide drip on admission and PPI therapy.  Patient is hemodynamically stable at this time.  Continue Protonix 40 mg IV every 12.  Continue ciprofloxacin 400 mg IV every 12 hours and, started 07/07/2014. Active problems:  History of alcohol abuse / alcoholic liver cirrhosis with esophageal varices, ascites and portal gastropathy  Alcohol level and UDS were not obtained at the time of the admission.  Will place order for US paracentesis, his abdomen is distended but not tender. Continue CIWA protocol. Continue folic acid, thiamine.   Acute respiratory failure secondary to hypovolemic shock   Patient was intubated for emergent EGD. He was extubate 07/05/2014.  Respiratory status remains stable.  Continue duo neb every 2 hours as needed for shortness of breath or wheezing. Hypertension  Continue metoprolol 25 mg by mouth twice a day. Hypokalemia  Secondary to GI losses. Being repleted. Followup BMP in the morning. Ileus / Abdominal distention  Pt developed ileus 07/06/2014. Also has ascites from liver cirrhosis. Diet as tolerated. Pancytopenia / anemia of chronic disease and acute blood loss anemia / thrombocytopenia / leukopenia  Likely secondary to bone marrow suppression from history of alcohol abuse  CBC stable with white blood cell count of 2.4, hemoglobin 9.4 and platelet count 98 Continue to monitor CBC Severe protein calorie malnutrition  Has muscle waisting, poor nutritional intake. Nutrition consulted.  Will add megace appetite stimulant per nutritional recommendations. H/O smoking  Nicotine patch ordered.   DVT prophylaxis: SCD's bilaterally while pt is in hospital due to risk of bleed.   Code Status: full code  Family Communication: plan of care discussed with the patient  Disposition Plan: home when stable   Consultants:  Gastroenterology (Dr. Estrella Deeds, [place]) Procedures:  7/20 EGD >> bleeding mallory weiss tear s/p epi injection and cautery, large non bleeding esophageal varices, portal HTN, gastropathy  7/21 Abd u/s >> gallstones, normal CBD, ascites, Rt pleural effusion Antibiotics:  Cipro 07/07/2014 -->   Manson Passey, MD  [facility] Pager [phone]  If 7PM-7AM, please contact night-coverage [email or web address] Password [email or web address] 07/09/2014, 1:44 PM   LOS: 6 days    HPI/Subjective: No acute overnight events.  Objective: Filed Vitals:   07/08/14 1100 07/08/14 1400 07/08/14 2026 07/09/14 0429  BP: 136/98 150/84 140/71 150/82  Pulse: 93 97 110 95  Temp:  97.4 F (36.3 C) 98.2  F (36.8 C) 98 F (36.7 C)  TempSrc:  Oral Oral Oral  Resp:  14 16 18 18   Height:      Weight:    64.9 kg (143 lb 1.3 oz)  SpO2: 97% 100% 97% 99%    Intake/Output Summary (Last 24 hours) at 07/09/14 1344 Last data filed at 07/09/14 0900  Gross per 24 hour  Intake    720 ml  Output   1100 ml  Net   -380 ml    Exam:   General:  Pt is alert, follows commands appropriately, not in acute distress; jaundiced skin  Cardiovascular: Regular rate and rhythm, S1/S2, no murmurs  Respiratory: Clear to auscultation bilaterally, no wheezing, no crackles, no rhonchi  Abdomen: distended but not tender, bowel sounds present  Extremities: No edema, pulses DP and PT palpable bilaterally  Neuro: Grossly nonfocal  Data Reviewed: Basic Metabolic Panel:  Recent Labs Lab 07/04/14 0016  07/04/14 1450  07/06/14 0333 07/06/14 1608 07/07/14 0343 07/07/14 1548 07/08/14 0345 07/08/14 1935 07/09/14 0433  NA 138  --   --   < > 137 137 136* 132* 130* 127* 129*  K 3.9  --   --   < > 3.1* 3.2* 3.5* 3.0* 2.5* 3.3* 3.4*  CL 90*  --   --   < > 101 102 99 92* 88* 88* 88*  CO2 28  --   --   < > 25 28 29 29 30 29 29   GLUCOSE 110*  --   --   < > 117* 110* 99 141* 120* 97 102*  BUN 19  --   --   < > 13 8 6 6  4* 6 6  CREATININE 0.63  --   --   < > 0.56 0.57 0.51 0.52 0.50 0.58 0.54  CALCIUM 8.6  --   --   < > 7.4* 7.3* 7.5* 7.9* 7.8* 7.9* 8.1*  MG  --   < > 1.3*  --  1.4* 1.7 1.4* 2.1 1.5  --   --   PHOS  --   --  2.9  --   --   --  2.9  --  3.0  --   --   < > = values in this interval not displayed. Liver Function Tests:  Recent Labs Lab 07/04/14 0016 07/05/14 0235  AST 51* 57*  ALT 15 14  ALKPHOS 98 67  BILITOT 1.4* 1.4*  PROT 6.5 5.4*  ALBUMIN 2.9* 2.3*   No results found for this basename: LIPASE, AMYLASE,  in the last 168 hours No results found for this basename: AMMONIA,  in the last 168 hours CBC:  Recent Labs Lab 07/05/14 0625 07/06/14 0333 07/07/14 0343 07/08/14 0345 07/09/14 0433  WBC 2.7* 2.5* 1.7* 2.1* 2.4*  HGB 8.9* 8.1* 8.8*  9.1* 9.4*  HCT 26.2* 24.3* 26.9* 27.5* 28.2*  MCV 80.1 80.7 82.8 81.4 81.7  PLT PLATELET CLUMPS NOTED ON SMEAR, COUNT APPEARS DECREASED 65* 64* 75* 98*   Cardiac Enzymes: No results found for this basename: CKTOTAL, CKMB, CKMBINDEX, TROPONINI,  in the last 168 hours BNP: No components found with this basename: POCBNP,  CBG: No results found for this basename: GLUCAP,  in the last 168 hours  Recent Results (from the past 240 hour(s))  MRSA PCR SCREENING     Status: None   Collection Time    07/04/14  6:46 AM      Result Value Ref Range Status   MRSA by  PCR NEGATIVE  NEGATIVE Final   Comment:            The GeneXpert MRSA Assay (FDA     approved for NASAL specimens     only), is one component of a     comprehensive MRSA colonization     surveillance program. It is not     intended to diagnose MRSA     infection nor to guide or     monitor treatment for     MRSA infections.     Studies: No results found.  Scheduled Meds: . antiseptic oral rinse  15 mL Mouth Rinse q12n4p  . chlorhexidine  15 mL Mouth Rinse BID  . ciprofloxacin  400 mg Intravenous Q12H  . feeding supplement (ENSURE COMPLETE)  237 mL Oral TID BM  . folic acid  1 mg Oral Daily  . metoprolol tartrate  25 mg Oral BID  . nicotine  21 mg Transdermal Daily  . pantoprazole (PROTONIX) IV  40 mg Intravenous Q12H  . thiamine  100 mg Oral Daily   Continuous Infusions: . sodium chloride 10 mL/hr at 07/08/14 2200

## 2014-07-09 NOTE — Progress Notes (Signed)
Clinical Social Work Department BRIEF PSYCHOSOCIAL ASSESSMENT 07/09/2014  Patient:  [name], [name]     Account Number:  [id number]     Admit date:  07/03/2014  Clinical Social Worker:  Levie Heritage  Date/Time:  07/09/2014 04:01 PM  Referred by:  Physician  Date Referred:  07/09/2014 Referred for  SNF Placement   Other Referral:   Interview type:  Patient Other interview type:    PSYCHOSOCIAL DATA Living Status:  FAMILY Admitted from facility:   Level of care:   Primary support name:  Gentry Fitz Primary support relationship to patient:  CHILD, ADULT Degree of support available:   strong    CURRENT CONCERNS Current Concerns  Post-Acute Placement   Other Concerns:    SOCIAL WORK ASSESSMENT / PLAN Met with Pt to discuss d/c plans.    Pt stated that he intends to d/c home, as he wants to see if he and his 67 year old son can do for him what he requires.  He stated that he feels that he will be able to manage on his own and that, if he can't, his son is there 24/7 to assist him.    Pt stated that he lives close to [facility] and that, if need be, he liked to go there for rehab.  He stated, though, that his preference is to go home and asked to have home health set up for him.  He's also requesting assistance with obtaining a PCP.    CSW notified RNCM.    No further CSW needs identified.    CSW to sign off.   Assessment/plan status:  No Further Intervention Required Other assessment/ plan:   Information/referral to community resources:   n/a--Pt wants to d/c home.    PATIENT'S/FAMILY'S RESPONSE TO PLAN OF CARE: Pt was calm, cooperative and pleasant.  Pt stated that he feels fine about going home, as his son is young and capable and can assist him with any need.   Bernita Raisin, [place] Social Work [phone]

## 2014-07-10 ENCOUNTER — Inpatient Hospital Stay (HOSPITAL_COMMUNITY): Payer: Medicare Other

## 2014-07-10 DIAGNOSIS — D638 Anemia in other chronic diseases classified elsewhere: Secondary | ICD-10-CM

## 2014-07-10 LAB — COMPREHENSIVE METABOLIC PANEL
ALBUMIN: 2.6 g/dL — AB (ref 3.5–5.2)
ALT: 13 U/L (ref 0–53)
AST: 38 U/L — ABNORMAL HIGH (ref 0–37)
Alkaline Phosphatase: 85 U/L (ref 39–117)
Anion gap: 9 (ref 5–15)
BUN: 8 mg/dL (ref 6–23)
CO2: 29 meq/L (ref 19–32)
CREATININE: 0.56 mg/dL (ref 0.50–1.35)
Calcium: 8.3 mg/dL — ABNORMAL LOW (ref 8.4–10.5)
Chloride: 91 mEq/L — ABNORMAL LOW (ref 96–112)
GFR calc Af Amer: >90 mL/min (ref >90)
Glucose, Bld: 102 mg/dL — ABNORMAL HIGH (ref 70–99)
POTASSIUM: 3.7 meq/L (ref 3.7–5.3)
Sodium: 129 mEq/L — ABNORMAL LOW (ref 137–147)
Total Bilirubin: 1.4 mg/dL — ABNORMAL HIGH (ref 0.3–1.2)
Total Protein: 5.8 g/dL — ABNORMAL LOW (ref 6.0–8.3)

## 2014-07-10 LAB — CBC
HCT: 26.5 % — ABNORMAL LOW (ref 39.0–52.0)
Hemoglobin: 8.7 g/dL — ABNORMAL LOW (ref 13.0–17.0)
MCH: 26.5 pg (ref 26.0–34.0)
MCHC: 32.8 g/dL (ref 30.0–36.0)
MCV: 80.8 fL (ref 78.0–100.0)
PLATELETS: 100 10*3/uL — AB (ref 150–400)
RBC: 3.28 MIL/uL — AB (ref 4.22–5.81)
RDW: 16.4 % — ABNORMAL HIGH (ref 11.5–15.5)
WBC: 2.5 10*3/uL — ABNORMAL LOW (ref 4.0–10.5)

## 2014-07-10 MED ORDER — ENSURE COMPLETE PO LIQD: 237.0000 mL | Freq: Three times a day (TID) | ORAL | Status: DC

## 2014-07-10 MED ORDER — CIPROFLOXACIN HCL 500 MG PO TABS
500.0000 mg | ORAL_TABLET | Freq: Two times a day (BID) | ORAL | Status: DC
Start: 2014-07-10 — End: 2014-09-11

## 2014-07-10 MED ORDER — THIAMINE HCL 100 MG PO TABS: 100.0000 mg | ORAL_TABLET | Freq: Every day | ORAL | Status: DC

## 2014-07-10 MED ORDER — PANTOPRAZOLE SODIUM 40 MG PO TBEC: 40.0000 mg | DELAYED_RELEASE_TABLET | Freq: Every day | ORAL | Status: DC

## 2014-07-10 MED ORDER — METOPROLOL TARTRATE 25 MG PO TABS: 25.0000 mg | ORAL_TABLET | Freq: Two times a day (BID) | ORAL | Status: DC

## 2014-07-10 MED ORDER — ACETAMINOPHEN 325 MG PO TABS: 650.0000 mg | ORAL_TABLET | Freq: Four times a day (QID) | ORAL | Status: DC | PRN

## 2014-07-10 MED ORDER — FOLIC ACID 1 MG PO TABS: 1.0000 mg | ORAL_TABLET | Freq: Every day | ORAL | Status: DC

## 2014-07-10 NOTE — Progress Notes (Signed)
Went over discharge instructions and prescriptions with patient, no further questions from patient or family. [facility]

## 2014-07-10 NOTE — Discharge Instructions (Signed)
Mallory-Weiss Syndrome °Mallory-Weiss syndrome refers to bleeding from tears in the lining of the esophagus near where it meets the stomach. This is often caused by forceful vomiting, retching or coughing. This condition is often associated with alcoholism. Usually the bleeding stops by itself after 24 to 48 hours. Sometimes endoscopic or surgical treatment is needed. This condition is not usually fatal. °SYMPTOMS °· Vomiting of bright red or black coffee ground like material. °· Black, tarry stools. °· Low blood pressure causing you to feel faint or experience loss of consciousness. °DIAGNOSIS  °Definitive diagnosis is by endoscopy. Treatment is usually supportive. Persistent bleeding is uncommon. Sometimes cauterization or injection of epinephrine to stop the bleeding is used during the diagnostic endoscopy. Embolization (obstruction) of the arteries supplying the area of bleeding is sometimes used to stop the bleeding. °· An NG tube (naso-gastric tube) may be inserted to determine where the bleeding is coming from. °· Often an EGD (esophagogastroduodenoscopy) is done. In this procedure there is a small flexible tube-like telescope (endoscope) put into your mouth, through your esophagus (the food tube leading from your mouth to your stomach), down into your stomach and into the small bowel. Through this your caregiver can see what and where the problem is. °TREATMENT  °It is necessary to stop the bleeding as soon as possible. During the EGD, your caregiver may inject medication into bleeding vessels to clot them.  °SEEK IMMEDIATE MEDICAL CARE IF: °· You have persistent dizziness, lightheadedness, or fainting. °· Your vomiting returns and you have blood in your stools. °· You have vomit that is bright red blood or black coffee ground-like blood, bright red blood in the stool or black tarry stools. °· You have chest pain. °· You cannot eat or drink. °· You have nausea or vomiting. °MAKE SURE YOU:  °· Understand  these instructions. °· Will watch your condition. °· Will get help right away if you are not doing well or get worse. °Document Released: 04/21/2006 Document Revised: 02/24/2012 Document Reviewed: 01/26/2014 °[employer]® Patient Information ©2015 [employer]. This information is not intended to replace advice given to you by your health care provider. Make sure you discuss any questions you have with your health care provider. ° °

## 2014-07-10 NOTE — Discharge Summary (Signed)
Physician Discharge Summary  PLEAS CARNEAL ZOX:[id number] DOB: Jun 28, 1946 DOA: 07/03/2014  PCP: No primary provider on file.  Admit date: 07/03/2014 Discharge date: 07/10/2014  Recommendations for Outpatient Follow-up:  1. Continue cipro as prescribed for 5 more days for possible infection related to liver cirrhosis. 2. Order was placed for home health physical therapy and nurse.  Discharge Diagnoses:  Principal Problem:   GIB (gastrointestinal bleeding) Active Problems:   Mallory-Weiss tear   Hemorrhagic shock   TOBACCO ABUSE   CHRONIC OBSTRUCTIVE PULMONARY DISEASE, MODERATE   Alcohol abuse   Ascites   Severe protein-calorie malnutrition   Hyponatremia   Cirrhosis   GERD (gastroesophageal reflux disease)   Malnutrition of moderate degree   Ileus   Other pancytopenia   Hypokalemia   Anemia of chronic disease   HTN (hypertension)    Discharge Condition: stable   Diet recommendation: as tolerated   History of present illness:  68 -year-old male with past medical history of presumed liver cirrhosis likely from alcohol abuse, previous history of ascites requiring large-volume paracentesis, GERD, COPD, hypertension who presented to [facility] ED 07/03/2014 with several day history of abdominal discomfort and acute onset hematemesis started 4-5 hours prior to this admission. Patient was found to be hypotensive, tachycardic with significant anemia concerning for major GI bleed. Patient was intubated for urgent EGD. Bedside EGD done by Dr. Bosie Clos showed bleeding likely secondary to Mallory-Weiss tear. Further, patient was found to have mild ileus. Care transitioned to [facility] [place] starting 07/08/2104.   Assessment/Plan:  Principal Problem:  Acute hemorrhagic shock / Upper GI bleed secondary to [employer] tear  Patient was hypotensive, tachycardiac with severe anemia on the admission. Patient underwent emergent EGD with findings of bleeding from Mallory-Weiss tear. Patient is status post epi  injection and cautery. Also seen on EGD was large nonbleeding esophageal varices, portal hypertension and gastropathy. Patient required octreotide drip on admission and PPI therapy.  Patient remains hemodynamically stable Continue PPI therapy  Continue ciprofloxacin for additional 5 days on discharge, it was started 07/07/2014 Active problems:  History of alcohol abuse / alcoholic liver cirrhosis with esophageal varices, ascites and portal gastropathy  Alcohol level and UDS were not obtained at the time of the admission.  Paracentesis not done due to small or minimal ascites.  Continue folic acid, thiamine.  Acute respiratory failure secondary to hypovolemic shock  Patient was intubated for emergent EGD. He was extubated 07/05/2014.  Respiratory status remains stable.  Continue duo neb every 2 hours as needed for shortness of breath or wheezing while in hospital. Hypertension  Continue metoprolol 25 mg by mouth twice a day. Hypokalemia  Secondary to GI losses. Repleted. Ileus / Abdominal distention  Pt developed ileus 07/06/2014. Also has small ascites from liver cirrhosis.  Diet as tolerated. Pancytopenia / anemia of chronic disease and acute blood loss anemia / thrombocytopenia / leukopenia  Likely secondary to bone marrow suppression from history of alcohol abuse  CBC stable with white blood cell count of 2.4, hemoglobin 9.4 and platelet count 98  Severe protein calorie malnutrition  Has muscle waisting, poor nutritional intake. Nutrition consulted.  Added megace appetite stimulant per nutritional recommendations but pt elected not to have this on discharge.  H/O smoking  Nicotine patch ordered.   DVT prophylaxis: SCD's bilaterally while pt is in hospital due to risk of bleed.   Code Status: full code  Family Communication: plan of care discussed with the patient   Consultants:  Gastroenterology (Dr. Estrella Deeds, [place]) Procedures:  7/20 EGD >> bleeding mallory weiss tear s/p epi  injection and cautery, large non bleeding esophageal varices, portal HTN, gastropathy  7/21 Abd u/s >> gallstones, normal CBD, ascites, Rt pleural effusion Antibiotics:  Cipro 07/07/2014 --> for 5 additional days on discahrge    Signed:  Manson Passey, MD  [facility] 07/10/2014, 1:35 PM  Pager #: [phone]   Discharge Exam: Filed Vitals:   07/10/14 0918  BP: 112/53  Pulse: 105  Temp:   Resp:    Filed Vitals:   07/09/14 1500 07/09/14 2114 07/10/14 0625 07/10/14 0918  BP: 107/58 125/62 116/60 112/53  Pulse: 102 99 81 105  Temp: 98.4 F (36.9 C) 97.9 F (36.6 C) 97.7 F (36.5 C)   TempSrc: Oral Oral Oral   Resp: 22 22 18    Height:      Weight:   65.6 kg (144 lb 10 oz)   SpO2: 97% 98% 97%     General: Pt is alert, follows commands appropriately, not in acute distress Cardiovascular: Regular rate and rhythm, S1/S2 appreciated  Respiratory: Clear to auscultation bilaterally, no wheezing, no crackles, no rhonchi Abdominal: Soft, non tender, non distended, bowel sounds +, no guarding Extremities: no edema, no cyanosis, pulses palpable bilaterally DP and PT Neuro: Grossly nonfocal  Discharge Instructions  Discharge Instructions   Call MD for:  difficulty breathing, headache or visual disturbances    Complete by:  As directed      Call MD for:  persistant dizziness or light-headedness    Complete by:  As directed      Call MD for:  persistant nausea and vomiting    Complete by:  As directed      Call MD for:  severe uncontrolled pain    Complete by:  As directed      Diet - low sodium heart healthy    Complete by:  As directed      Discharge instructions    Complete by:  As directed   1. Continue cipro as prescribed for 5 more days for possible infection related to liver cirrhosis. 2. Order was placed for home health physical therapy and nurse.     Increase activity slowly    Complete by:  As directed             Medication List          acetaminophen 325 MG tablet  Commonly known as:  TYLENOL  Take 2 tablets (650 mg total) by mouth every 6 (six) hours as needed for mild pain, fever or headache.     ALLERGY 10 MG tablet  Generic drug:  loratadine  Take 10 mg by mouth daily.     ciprofloxacin 500 MG tablet  Commonly known as:  CIPRO  Take 1 tablet (500 mg total) by mouth 2 (two) times daily.     feeding supplement (ENSURE COMPLETE) Liqd  Take 237 mLs by mouth 3 (three) times daily between meals.     folic acid 1 MG tablet  Commonly known as:  FOLVITE  Take 1 tablet (1 mg total) by mouth daily.     GOODYS EXTRA STRENGTH 520-260-32.5 MG Pack  Generic drug:  Aspirin-Acetaminophen-Caffeine  Take 1 Package by mouth 2 (two) times daily.     metoprolol tartrate 25 MG tablet  Commonly known as:  LOPRESSOR  Take 1 tablet (25 mg total) by mouth 2 (two) times daily.     pantoprazole 40 MG tablet  Commonly known as:  PROTONIX  Take  1 tablet (40 mg total) by mouth daily.     thiamine 100 MG tablet  Take 1 tablet (100 mg total) by mouth daily.           Follow-up Information   Follow up with [facility]    . Schedule an appointment as soon as possible for a visit in 1 week. (Follow up appt after recent hospitalization)    Contact information:   [address] [place]wynn Bur[place] [address] [phone]       The results of significant diagnostics from this hospitalization (including imaging, microbiology, ancillary and laboratory) are listed below for reference.    Significant Diagnostic Studies: Dg Abd 1 View  07/04/2014   CLINICAL DATA:  GI bleeding.  Nasogastric tube placement.  EXAM: ABDOMEN - 1 VIEW  COMPARISON:  Lumbar spine radiographs performed 03/08/2008  FINDINGS: The patient's nasogastric tube is noted ending at the fundus of the stomach, with the sideport at the body of the stomach.  The visualized bowel gas pattern is grossly unremarkable. No free intra-abdominal air is  seen, though evaluation for free air is limited on a single supine view. Minimal degenerative change is noted at L2-L3. No acute osseous abnormalities are seen.  IMPRESSION: Nasogastric tube noted ending at the fundus of the stomach, with the sideport at the body of the stomach.   Electronically Signed   By: Roanna Raider M.D.   On: 07/04/2014 03:52   US Abdomen Complete  07/05/2014   CLINICAL DATA:  Portal hypertension, cirrhosis  EXAM: ULTRASOUND ABDOMEN COMPLETE  COMPARISON:  01/07/2013  FINDINGS: Gallbladder:  Gallstones are noted within gallbladder the largest measures 3.6 mm. There is borderline thickening of gallbladder wall up to 3.1 mm. Perihepatic ascites is noted. No sonographic Murphy's sign.  Common bile duct:  Diameter: 3 mm in diameter within normal limits.  Liver:  Again noted nodular contour of the liver with diffuse increased echogenicity consistent with cirrhosis. No focal hepatic mass.  IVC:  No abnormality visualized.  Pancreas:  Visualized portion unremarkable.  Spleen:  Size and appearance within normal limits.  Measures 11 cm in length.  Right Kidney:  Length: 11 cm length. Echogenicity within normal limits. No mass or hydronephrosis visualized.  Left Kidney:  Length: 11 cm length. Echogenicity within normal limits. No mass or hydronephrosis visualized.  Abdominal aorta:  No aneurysm visualized.  Measures up to 2.2 cm in diameter.  Other findings:  Abdominal ascites is noted.  There is right pleural effusion.  IMPRESSION: 1. Gallstones are noted within gallbladder. The largest measures 3.6 mm. Borderline thickening of gallbladder wall without sonographic Murphy's sign. 2. Normal CBD. There is again noted cirrhotic liver without focal hepatic mass. 3. No hydronephrosis. 4. Abdominal ascites.  Right pleural effusion.   Electronically Signed   By: Natasha Mead M.D.   On: 07/05/2014 15:22   US Abdomen Limited  07/10/2014   CLINICAL DATA:  Request for paracentesis.  Ascites  EXAM: US ABDOMEN  LIMITED  COMPARISON:  Previous ultrasound from 07/05/2014  FINDINGS: Limited ultrasound of the abdomen reveals perihepatic ascites superiorly. Small volume of ascites in the left lower quadrant. For a brief moment, there appeared to be adequate window for percutaneous access. However, this could not be reproduced. The patient's bowel was very active ultimately, a safe percutaneous window could not be visualized. Overall, the volume of ascites appears less than on previous study. Paracentesis was not able to be performed.  IMPRESSION: Small volume of  ascites, not amenable for percutaneous paracentesis.  Read by: Brayton El PA-C   Electronically Signed   By: Ruel Favors M.D.   On: 07/10/2014 11:08   Dg Chest Port 1 View  07/04/2014   CLINICAL DATA:  Pulmonary edema, mechanical ventilation  EXAM: PORTABLE CHEST - 1 VIEW  COMPARISON:  07/04/2014  FINDINGS: Endotracheal tube with the tip 2.5 cm above the carina. Mild bilateral interstitial thickening. No pleural effusion, pneumothorax or focal consolidation. Stable cardiomediastinal silhouette. Unremarkable osseous structures.  IMPRESSION: 1. Endotracheal tube with the tip 2.5 cm above the carina. 2. Mild bilateral interstitial prominence.   Electronically Signed   By: Elige Ko   On: 07/04/2014 12:47   Dg Chest Port 1 View  07/04/2014   CLINICAL DATA:  Endotracheal tube placement.  EXAM: PORTABLE CHEST - 1 VIEW  COMPARISON:  Chest radiograph from 01/07/2013  FINDINGS: The patient's endotracheal tube is seen ending 3 cm above the carina. An enteric tube is noted extending below the diaphragm.  Vascular congestion is noted. Mildly increased interstitial markings could reflect minimal interstitial edema. No pleural effusion or pneumothorax is seen.  The cardiomediastinal silhouette is normal in size. No acute osseous abnormalities are identified.  IMPRESSION: 1. Endotracheal tube seen ending 3 cm above the carina. 2. Vascular congestion noted. Mildly  increased interstitial markings could reflect minimal interstitial edema.   Electronically Signed   By: Roanna Raider M.D.   On: 07/04/2014 04:32   Dg Abd Portable 1v  07/06/2014   CLINICAL DATA:  Abdominal pain and distention.  Rule out ileus.  EXAM: PORTABLE ABDOMEN - 1 VIEW  COMPARISON:  07/04/2014  FINDINGS: There is new, mild gaseous distension of small and large bowel loops. Small bowel loops measure up to approximately 3 cm in diameter. There is no gross intraperitoneal free air. Enteric tube has been removed. Moderate lumbar spondylosis is noted.  IMPRESSION: New, mild gaseous distension of small and large bowel, which may reflect mild ileus.   Electronically Signed   By: Sebastian Ache   On: 07/06/2014 09:04    Microbiology: Recent Results (from the past 240 hour(s))  MRSA PCR SCREENING     Status: None   Collection Time    07/04/14  6:46 AM      Result Value Ref Range Status   MRSA by PCR NEGATIVE  NEGATIVE Final   Comment:            The GeneXpert MRSA Assay (FDA     approved for NASAL specimens     only), is one component of a     comprehensive MRSA colonization     surveillance program. It is not     intended to diagnose MRSA     infection nor to guide or     monitor treatment for     MRSA infections.     Labs: Basic Metabolic Panel:  Recent Labs Lab 07/04/14 0016  07/04/14 1450  07/06/14 0333 07/06/14 1608 07/07/14 0343 07/07/14 1548 07/08/14 0345 07/08/14 1935 07/09/14 0433 07/10/14 0426  NA 138  --   --   < > 137 137 136* 132* 130* 127* 129* 129*  K 3.9  --   --   < > 3.1* 3.2* 3.5* 3.0* 2.5* 3.3* 3.4* 3.7  CL 90*  --   --   < > 101 102 99 92* 88* 88* 88* 91*  CO2 28  --   --   < > 25 28 29 29  30  29 29 29   GLUCOSE 110*  --   --   < > 117* 110* 99 141* 120* 97 102* 102*  BUN 19  --   --   < > 13 8 6 6  4* 6 6 8   CREATININE 0.63  --   --   < > 0.56 0.57 0.51 0.52 0.50 0.58 0.54 0.56  CALCIUM 8.6  --   --   < > 7.4* 7.3* 7.5* 7.9* 7.8* 7.9* 8.1* 8.3*  MG  --    < > 1.3*  --  1.4* 1.7 1.4* 2.1 1.5  --   --   --   PHOS  --   --  2.9  --   --   --  2.9  --  3.0  --   --   --   < > = values in this interval not displayed. Liver Function Tests:  Recent Labs Lab 07/04/14 0016 07/05/14 0235 07/10/14 0426  AST 51* 57* 38*  ALT 15 14 13   ALKPHOS 98 67 85  BILITOT 1.4* 1.4* 1.4*  PROT 6.5 5.4* 5.8*  ALBUMIN 2.9* 2.3* 2.6*   No results found for this basename: LIPASE, AMYLASE,  in the last 168 hours No results found for this basename: AMMONIA,  in the last 168 hours CBC:  Recent Labs Lab 07/06/14 0333 07/07/14 0343 07/08/14 0345 07/09/14 0433 07/10/14 0426  WBC 2.5* 1.7* 2.1* 2.4* 2.5*  HGB 8.1* 8.8* 9.1* 9.4* 8.7*  HCT 24.3* 26.9* 27.5* 28.2* 26.5*  MCV 80.7 82.8 81.4 81.7 80.8  PLT 65* 64* 75* 98* 100*   Cardiac Enzymes: No results found for this basename: CKTOTAL, CKMB, CKMBINDEX, TROPONINI,  in the last 168 hours BNP: BNP (last 3 results) No results found for this basename: PROBNP,  in the last 8760 hours CBG: No results found for this basename: GLUCAP,  in the last 168 hours  Time coordinating discharge: Over 30 minutes

## 2014-07-10 NOTE — Progress Notes (Signed)
CARE MANAGEMENT NOTE 07/10/2014  Patient:  [name],[name]   Account Number:  [id number]  Date Initiated:  07/04/2014  Documentation initiated by:  DAVIS,Brett  Subjective/Objective Assessment:   pt admitted with hematemsis and requiring intubation for airway protection.     Action/Plan:   tbd   Anticipated DC Date:  07/10/2014   Anticipated DC Plan:  HOME/SELF CARE  In-house referral  Clinical Social Worker      DC Associate Professorlanning Services  CM consult      [facility]   Choice offered to / List presented to:  [name]        HH arranged  HH-1 RN  HH-2 PT  HH-3 OT  HH-4 NURSE'S AIDE      HH agency  [facility]   Status of service:  Completed, signed off Medicare Important Message given?  YES (If response is "NO", the following Medicare IM given date fields will be blank) Date Medicare IM given:  07/10/2014 Medicare IM given by:  [name],Mairen Wallenstein Date Additional Medicare IM given:   Additional Medicare IM given by:    Discharge Disposition:  HOME W HOME HEALTH SERVICES  Per UR Regulation:  Reviewed for med. necessity/level of care/duration of stay  If discussed at Long Length of Stay Meetings, dates discussed:    Comments:  07/10/2014 1600 [facility] cannot acccept referral. [facility] Isidoro DonningAlesia Anyah Swallow RN CCM Case Mgmt phone [phone]  07/10/2014 1200 NCM spoke to pt and [facility] Pt states he has RW at home. Pt states his son, [name] lives in the home with him. Notified [facility] with [facility] home today. Isidoro DonningAlesia Olevia Westervelt RN CCM Case Mgmt phone [phone]  [phone]/Brett Stark JockDavis, RN, BSN, [place] [phone] Chart Reviewed for discharge and hospital needs. Discharge needs at time of review: None present will follow for needs. Review of patient progress due on [id number].   Brett Davis,Rn,BSN,CCM

## 2014-07-18 ENCOUNTER — Encounter (HOSPITAL_COMMUNITY): Payer: Self-pay | Admitting: Emergency Medicine

## 2014-07-18 ENCOUNTER — Emergency Department (HOSPITAL_COMMUNITY): Payer: Medicare Other

## 2014-07-18 ENCOUNTER — Emergency Department (HOSPITAL_COMMUNITY)
Admission: EM | Admit: 2014-07-18 | Discharge: 2014-07-18 | Disposition: A | Discharge status: Home / Self Care | Payer: Medicare Other | Attending: Emergency Medicine | Admitting: Emergency Medicine

## 2014-07-18 DIAGNOSIS — K219 Gastro-esophageal reflux disease without esophagitis: Secondary | ICD-10-CM | POA: Diagnosis not present

## 2014-07-18 DIAGNOSIS — S92009A Unspecified fracture of unspecified calcaneus, initial encounter for closed fracture: Secondary | ICD-10-CM | POA: Insufficient documentation

## 2014-07-18 DIAGNOSIS — M129 Arthropathy, unspecified: Secondary | ICD-10-CM | POA: Insufficient documentation

## 2014-07-18 DIAGNOSIS — Z79899 Other long term (current) drug therapy: Secondary | ICD-10-CM | POA: Insufficient documentation

## 2014-07-18 DIAGNOSIS — J449 Chronic obstructive pulmonary disease, unspecified: Secondary | ICD-10-CM | POA: Diagnosis not present

## 2014-07-18 DIAGNOSIS — S92001A Unspecified fracture of right calcaneus, initial encounter for closed fracture: Secondary | ICD-10-CM

## 2014-07-18 DIAGNOSIS — W108XXA Fall (on) (from) other stairs and steps, initial encounter: Secondary | ICD-10-CM | POA: Insufficient documentation

## 2014-07-18 DIAGNOSIS — F17200 Nicotine dependence, unspecified, uncomplicated: Secondary | ICD-10-CM | POA: Insufficient documentation

## 2014-07-18 DIAGNOSIS — Z792 Long term (current) use of antibiotics: Secondary | ICD-10-CM | POA: Insufficient documentation

## 2014-07-18 DIAGNOSIS — G8929 Other chronic pain: Secondary | ICD-10-CM | POA: Insufficient documentation

## 2014-07-18 DIAGNOSIS — Y9389 Activity, other specified: Secondary | ICD-10-CM | POA: Diagnosis not present

## 2014-07-18 DIAGNOSIS — S99919A Unspecified injury of unspecified ankle, initial encounter: Secondary | ICD-10-CM | POA: Diagnosis present

## 2014-07-18 DIAGNOSIS — S8990XA Unspecified injury of unspecified lower leg, initial encounter: Secondary | ICD-10-CM | POA: Insufficient documentation

## 2014-07-18 DIAGNOSIS — Y929 Unspecified place or not applicable: Secondary | ICD-10-CM | POA: Diagnosis not present

## 2014-07-18 DIAGNOSIS — S99929A Unspecified injury of unspecified foot, initial encounter: Secondary | ICD-10-CM

## 2014-07-18 DIAGNOSIS — J4489 Other specified chronic obstructive pulmonary disease: Secondary | ICD-10-CM | POA: Insufficient documentation

## 2014-07-18 MED ORDER — HYDROCODONE-ACETAMINOPHEN 5-325 MG PO TABS: 1.0000 | ORAL_TABLET | Freq: Four times a day (QID) | ORAL | Status: DC | PRN

## 2014-07-18 MED ORDER — HYDROCODONE-ACETAMINOPHEN 5-325 MG PO TABS
1.0000 | ORAL_TABLET | Freq: Once | ORAL | Status: AC
  Administered 2014-07-18: 1 via ORAL
  Filled 2014-07-18: qty 1

## 2014-07-18 NOTE — ED Notes (Signed)
Bed: WA03 Expected date:  Expected time:  Means of arrival:  Comments: EMS R ankle pain s/p fall

## 2014-07-18 NOTE — Discharge Instructions (Signed)
As discussed, it is important that you follow up with our orthopedist within one week for additional evaluation, and management.  Please return here for any concerning changes in your condition.

## 2014-07-18 NOTE — ED Notes (Signed)
Pt arrived to the ED with a complaint of right ankle pain.  Pt states he step off of a step and twisted his ankle. Pt has limited swelling and no bruising.

## 2014-07-18 NOTE — ED Provider Notes (Addendum)
CSN: [id number]     Arrival date & time 07/18/14  0257 History   First MD Initiated Contact with Patient 07/18/14 0259     Chief Complaint  Patient presents with  . Ankle Pain      HPI  Patient presents after minor trauma with ongoing right ankle pain. Patient missed a step, fell to the ground.  Since that time he said pain persistently in the right ankle, no other locations, he denies head trauma, other complaints. Pain is sore, severe, posterior and diffuse. No distal dysesthesia or weakness. Patient has not been ambulatory since the fall. Patient was hospitalized, discharged several days ago, states that he was well since discharge.   Past Medical History  Diagnosis Date  . COPD (chronic obstructive pulmonary disease)   . Acid reflux   . IBS (irritable bowel syndrome)   . Chronic back pain   . Arthritis   . Collapsed lung    Past Surgical History  Procedure Laterality Date  . Skin graft    . Skin graft    . Esophagogastroduodenoscopy N/A 07/04/2014    Procedure: ESOPHAGOGASTRODUODENOSCOPY (EGD);  Surgeon: Shirley FriarVincent C. Schooler, MD;  Location: [facility];  Service: Endoscopy;  Laterality: N/A;   History reviewed. No pertinent family history. History  Substance Use Topics  . Smoking status: Current Every Day Smoker -- 0.50 packs/day    Types: Cigarettes  . Smokeless tobacco: Not on file  . Alcohol Use: 21.0 oz/week    35 Cans of beer per week    Review of Systems  All other systems reviewed and are negative.     Allergies  Review of patient's allergies indicates no known allergies.  Home Medications   Prior to Admission medications   Medication Sig Start Date End Date Taking? Authorizing Provider  acetaminophen (TYLENOL) 325 MG tablet Take 2 tablets (650 mg total) by mouth every 6 (six) hours as needed for mild pain, fever or headache. 07/10/14  Yes Alison MurrayAlma M Devine, MD  Aspirin-Acetaminophen-Caffeine (GOODYS EXTRA STRENGTH) 520-260-32.5 MG PACK Take 1 Package by  mouth 2 (two) times daily.   Yes Historical Provider, MD  folic acid (FOLVITE) 1 MG tablet Take 1 tablet (1 mg total) by mouth daily. 07/10/14  Yes Alison MurrayAlma M Devine, MD  loratadine (ALLERGY) 10 MG tablet Take 10 mg by mouth daily.   Yes Historical Provider, MD  pantoprazole (PROTONIX) 40 MG tablet Take 1 tablet (40 mg total) by mouth daily. 07/10/14  Yes Alison MurrayAlma M Devine, MD  thiamine 100 MG tablet Take 1 tablet (100 mg total) by mouth daily. 07/10/14  Yes Alison MurrayAlma M Devine, MD  ciprofloxacin (CIPRO) 500 MG tablet Take 1 tablet (500 mg total) by mouth 2 (two) times daily. 07/10/14   Alison MurrayAlma M Devine, MD  feeding supplement, ENSURE COMPLETE, (ENSURE COMPLETE) LIQD Take 237 mLs by mouth 3 (three) times daily between meals. 07/10/14   Alison MurrayAlma M Devine, MD  metoprolol tartrate (LOPRESSOR) 25 MG tablet Take 1 tablet (25 mg total) by mouth 2 (two) times daily. 07/10/14   Alison MurrayAlma M Devine, MD   BP 135/69  Pulse 110  Temp(Src) 98.8 F (37.1 C) (Oral)  Resp 16  SpO2 94% Physical Exam  Nursing note and vitals reviewed. Constitutional: He is oriented to person, place, and time. He appears well-developed. No distress.  HENT:  Head: Normocephalic and atraumatic.  Eyes: Conjunctivae and EOM are normal.  Cardiovascular: Normal rate and regular rhythm.   Pulmonary/Chest: Effort normal. No stridor. No respiratory distress.  Abdominal: He exhibits no distension.  Musculoskeletal: He exhibits no edema.       Right knee: Normal.       Right ankle: He exhibits decreased range of motion and swelling. He exhibits no ecchymosis, no deformity, no laceration and normal pulse. Tenderness. Lateral malleolus and medial malleolus tenderness found. No AITFL, no CF ligament, no posterior TFL, no head of 5th metatarsal and no proximal fibula tenderness found. Achilles tendon normal.       Left ankle: Normal.   Patient can plantar flex the foot, though he is hesitant to do so secondary to pain  Neurological: He is alert and oriented to person,  place, and time.  Skin: Skin is warm and dry.  Psychiatric: He has a normal mood and affect.    ED Course  ORTHOPEDIC INJURY TREATMENT Date/Time: 07/18/2014 4:39 AM Performed by: Gerhard Munch Authorized by: Gerhard Munch Consent: Verbal consent obtained. The procedure was performed in an emergent situation. Risks and benefits: risks, benefits and alternatives were discussed Consent given by: patient Patient understanding: patient states understanding of the procedure being performed Patient consent: the patient's understanding of the procedure matches consent given Procedure consent: procedure consent matches procedure scheduled Relevant documents: relevant documents present and verified Test results: test results available and properly labeled Site marked: the operative site was marked Imaging studies: imaging studies available Required items: required blood products, implants, devices, and special equipment available Patient identity confirmed: verbally with patient Injury location: foot Location details: right foot Injury type: fracture Fracture type: calcaneal Pre-procedure neurovascular assessment: neurovascularly intact Pre-procedure distal perfusion: normal Pre-procedure neurological function: normal Pre-procedure range of motion: reduced Local anesthesia used: no Patient sedated: no Manipulation performed: no Immobilization: crutches (cam walker) Splint type: cam walker. Post-procedure neurovascular assessment: post-procedure neurovascularly intact Post-procedure distal perfusion: normal Post-procedure neurological function: normal Post-procedure range of motion: unchanged Patient tolerance: Patient tolerated the procedure well with no immediate complications.   (including critical care time) I reviewed the electronic medical record, including the patient's recent hospitalization for GI bleed.   Imaging Review Dg Ankle Complete Right  07/18/2014   CLINICAL  DATA:  Ankle pain after fall  EXAM: RIGHT ANKLE - COMPLETE 3+ VIEW  COMPARISON:  None.  FINDINGS: Comminuted calcaneus fracture with a distracted fracture at the level of the tuberosity. There is likely also involvement at the level of the upper body, near the posterior subtalar facet. No hindfoot dislocation. No acute fracture at the ankle. Marked osteopenia for age.  There is an amorphous calcification in the plantar soft tissues of the forefoot, at the level of the third metatarsal. This is relatively stable from 2009, likely heterotopic ossification.  IMPRESSION: 1. Comminuted calcaneus fracture, involving the body and tuberosity at least. 2. Osteopenia.   Electronically Signed   By: Tiburcio Pea M.D.   On: 07/18/2014 04:13    On repeat exam the patient is in no distress. I reviewed the x-ray findings with him.  He'll follow up with orthopedists.   MDM  Patient presents after fall with pain in the right heel and ankle. Patient has comminuted calcaneal fracture.  Patient is distally neurovascularly intact.  Patient had embolization with Cam Walker, crutches, was discharged in stable condition to follow up with orthopedics.    Gerhard Munch, MD 07/18/14 1610  Gerhard Munch, MD 07/18/14 [phone]

## 2014-09-11 ENCOUNTER — Emergency Department (HOSPITAL_COMMUNITY)
Admission: EM | Admit: 2014-09-11 | Discharge: 2014-09-12 | Disposition: A | Discharge status: Home / Self Care | Payer: Medicare Other | Attending: Emergency Medicine | Admitting: Emergency Medicine

## 2014-09-11 ENCOUNTER — Emergency Department (HOSPITAL_COMMUNITY): Payer: Medicare Other

## 2014-09-11 ENCOUNTER — Encounter (HOSPITAL_COMMUNITY): Payer: Self-pay | Admitting: Emergency Medicine

## 2014-09-11 DIAGNOSIS — Y939 Activity, unspecified: Secondary | ICD-10-CM | POA: Diagnosis not present

## 2014-09-11 DIAGNOSIS — S6990XA Unspecified injury of unspecified wrist, hand and finger(s), initial encounter: Secondary | ICD-10-CM

## 2014-09-11 DIAGNOSIS — J449 Chronic obstructive pulmonary disease, unspecified: Secondary | ICD-10-CM | POA: Diagnosis not present

## 2014-09-11 DIAGNOSIS — Z8739 Personal history of other diseases of the musculoskeletal system and connective tissue: Secondary | ICD-10-CM | POA: Diagnosis not present

## 2014-09-11 DIAGNOSIS — S62109A Fracture of unspecified carpal bone, unspecified wrist, initial encounter for closed fracture: Secondary | ICD-10-CM | POA: Insufficient documentation

## 2014-09-11 DIAGNOSIS — Y929 Unspecified place or not applicable: Secondary | ICD-10-CM | POA: Diagnosis not present

## 2014-09-11 DIAGNOSIS — Z79899 Other long term (current) drug therapy: Secondary | ICD-10-CM | POA: Insufficient documentation

## 2014-09-11 DIAGNOSIS — J4489 Other specified chronic obstructive pulmonary disease: Secondary | ICD-10-CM | POA: Insufficient documentation

## 2014-09-11 DIAGNOSIS — Z8719 Personal history of other diseases of the digestive system: Secondary | ICD-10-CM | POA: Insufficient documentation

## 2014-09-11 DIAGNOSIS — W010XXA Fall on same level from slipping, tripping and stumbling without subsequent striking against object, initial encounter: Secondary | ICD-10-CM | POA: Diagnosis not present

## 2014-09-11 DIAGNOSIS — F17200 Nicotine dependence, unspecified, uncomplicated: Secondary | ICD-10-CM | POA: Insufficient documentation

## 2014-09-11 DIAGNOSIS — S62102A Fracture of unspecified carpal bone, left wrist, initial encounter for closed fracture: Secondary | ICD-10-CM

## 2014-09-11 DIAGNOSIS — S59919A Unspecified injury of unspecified forearm, initial encounter: Secondary | ICD-10-CM

## 2014-09-11 DIAGNOSIS — S59909A Unspecified injury of unspecified elbow, initial encounter: Secondary | ICD-10-CM | POA: Insufficient documentation

## 2014-09-11 DIAGNOSIS — G8929 Other chronic pain: Secondary | ICD-10-CM | POA: Diagnosis not present

## 2014-09-11 NOTE — ED Notes (Signed)
Per EMS pt had a fall 4 days ago injuring his left wrist.  Per EMS left wrist is swollen and pt is rating pain 7/10.

## 2014-09-11 NOTE — ED Provider Notes (Signed)
CSN: [id number]     Arrival date & time 09/11/14  2235 History   First MD Initiated Contact with Patient 09/11/14 2244     Chief Complaint  Patient presents with  . Wrist Pain     (Consider location/radiation/quality/duration/timing/severity/associated sxs/prior Treatment) HPI  [name] is a(n) 68 y.o. male who presents for L wrist pain. The patient fell 4 days ago after tripping over a drop cord. He had immediate pain in the left wrist. He c/o pain that has been increasingly worseing with associacted swelling, bruising. He c/o throbbing pain in the wrist and has been unable to use the hand since the incident. He denies hitting his head or losing consciousness.    Past Medical History  Diagnosis Date  . COPD (chronic obstructive pulmonary disease)   . Acid reflux   . IBS (irritable bowel syndrome)   . Chronic back pain   . Arthritis   . Collapsed lung    Past Surgical History  Procedure Laterality Date  . Skin graft    . Skin graft    . Esophagogastroduodenoscopy N/A 07/04/2014    Procedure: ESOPHAGOGASTRODUODENOSCOPY (EGD);  Surgeon: Shirley Friar, MD;  Location: [facility];  Service: Endoscopy;  Laterality: N/A;   History reviewed. No pertinent family history. History  Substance Use Topics  . Smoking status: Current Every Day Smoker -- 0.50 packs/day    Types: Cigarettes  . Smokeless tobacco: Not on file  . Alcohol Use: 21.0 oz/week    35 Cans of beer per week    Review of Systems  Ten systems reviewed and are negative for acute change, except as noted in the HPI.    Allergies  Review of patient's allergies indicates no known allergies.  Home Medications   Prior to Admission medications   Medication Sig Start Date End Date Taking? Authorizing Provider  ciprofloxacin (CIPRO) 500 MG tablet Take 1 tablet (500 mg total) by mouth 2 (two) times daily. 07/10/14   Alison Murray, MD  feeding supplement, ENSURE COMPLETE, (ENSURE COMPLETE) LIQD Take 237 mLs  by mouth 3 (three) times daily between meals. 07/10/14   Alison Murray, MD  folic acid (FOLVITE) 1 MG tablet Take 1 tablet (1 mg total) by mouth daily. 07/10/14   Alison Murray, MD  HYDROcodone-acetaminophen (NORCO/VICODIN) 5-325 MG per tablet Take 1 tablet by mouth every 6 (six) hours as needed. 07/18/14   Gerhard Munch, MD  loratadine (ALLERGY) 10 MG tablet Take 10 mg by mouth daily.    Historical Provider, MD  metoprolol tartrate (LOPRESSOR) 25 MG tablet Take 1 tablet (25 mg total) by mouth 2 (two) times daily. 07/10/14   Alison Murray, MD  pantoprazole (PROTONIX) 40 MG tablet Take 1 tablet (40 mg total) by mouth daily. 07/10/14   Alison Murray, MD  thiamine 100 MG tablet Take 1 tablet (100 mg total) by mouth daily. 07/10/14   Alison Murray, MD   BP 159/92  Pulse 71  Temp(Src) 98.6 F (37 C) (Oral)  Resp 18  SpO2 94% Physical Exam  Nursing note and vitals reviewed. Constitutional: He is oriented to person, place, and time. He appears well-developed and well-nourished. No distress.  HENT:  Head: Normocephalic and atraumatic.  Eyes: Conjunctivae are normal. No scleral icterus.  Neck: Normal range of motion. Neck supple.  Cardiovascular: Normal rate, regular rhythm and normal heart sounds.   Pulmonary/Chest: Effort normal and breath sounds normal. No respiratory distress.  Abdominal: Soft. There is no tenderness.  Musculoskeletal: He exhibits no edema.  A left wrist exam was performed. HAND EXAM PERFORMED: in splint/cast. SKIN: intact, bruising, abrasion at the Proximal forearm and no evidence of infection SWELLING: significant WARMTH: mildly increased warmth TENDERNESS: diffuse NEUROVASCULAR EXAM normal   Neurological: He is alert and oriented to person, place, and time.  Skin: Skin is warm and dry. He is not diaphoretic.  Psychiatric: His behavior is normal.    ED Course  Procedures (including critical care time) Labs Review Labs Reviewed - No data to display  Imaging  Review Dg Wrist Complete Left  09/11/2014   CLINICAL DATA:  Fall  EXAM: LEFT WRIST - COMPLETE 3+ VIEW  COMPARISON:  None.  FINDINGS: There is an acute transverse fracture through the distal radial shaft with slight impaction. There is minimal dorsal angulation. Additional acute transverse fracture through the base of the ulnar styloid present as well. Diffuse soft tissue swelling overlies the wrist.  IMPRESSION: 1. Acute transverse fracture through the distal radial shaft with slight impaction and dorsal angulation. 2. Acute nondisplaced fracture through the base of the ulnar styloid.   Electronically Signed   By: Rise Mu M.D.   On: 09/11/2014 23:14     EKG Interpretation None      SPLINT APPLICATION Date/Time: 12:55 PM Authorized by: Arthor Captain Consent: Verbal consent obtained. Risks and benefits: risks, benefits and alternatives were discussed Consent given by: patient Splint applied by: orthopedic technician Location details: L wrist Splint type: sugar tong Supplies used: plaster, ace webrel Post-procedure: The splinted body part was neurovascularly unchanged following the procedure. Patient tolerance: Patient tolerated the procedure well with no immediate complications.    MDM   Final diagnoses:  Left wrist fracture, closed, initial encounter    12:50 PM  Imaging shows distal radial and ulnar fracture. i spoke with Dr. Melvyn Novas who asks for splint and op f/u .  He is pace in sugar tong splint.   Op f/u  Arthor Captain, PA-C 09/12/14 1256

## 2014-09-11 NOTE — ED Notes (Signed)
Bed: ZO10 Expected date:  Expected time:  Means of arrival:  Comments: Left wrist injury

## 2014-09-12 MED ORDER — HYDROCODONE-ACETAMINOPHEN 5-325 MG PO TABS: 1.0000 | ORAL_TABLET | ORAL | Status: DC | PRN

## 2014-09-12 MED ORDER — NAPROXEN 500 MG PO TABS: 500.0000 mg | ORAL_TABLET | Freq: Two times a day (BID) | ORAL | Status: DC

## 2014-09-12 MED ORDER — ACETAMINOPHEN 500 MG PO TABS
1000.0000 mg | ORAL_TABLET | Freq: Once | ORAL | Status: AC
  Administered 2014-09-12: 1000 mg via ORAL
  Filled 2014-09-12: qty 2

## 2014-09-12 NOTE — Progress Notes (Signed)
Orthopedic Tech Progress Note Patient Details:  [name] 1946/07/15 [id number]  Ortho Devices Type of Ortho Device: Sugartong splint;Arm sling Ortho Device/Splint Interventions: Application   Haskell Flirt 09/12/2014, 12:51 AM

## 2014-09-12 NOTE — Discharge Instructions (Signed)
Wrist Fracture A wrist fracture is a break or crack in one of the bones of your wrist. Your wrist is made up of eight small bones at the palm of your hand (carpal bones) and two long bones that make up your forearm (radius and ulna).  CAUSES   A direct blow to the wrist.  Falling on an outstretched hand.  Trauma, such as a car accident or a fall. RISK FACTORS Risk factors for wrist fracture include:   Participating in contact and high-risk sports, such as skiing, biking, and ice skating.  Taking steroid medicines.  Smoking.  Being male.  Being Caucasian.  Drinking more than three alcoholic beverages per day.  Having low or lowered bone density (osteoporosis or osteopenia).  Age. Older adults have decreased bone density.  Women who have had menopause.  History of previous fractures. SIGNS AND SYMPTOMS Symptoms of wrist fractures include tenderness, bruising, and inflammation. Additionally, the wrist may hang in an odd position or appear deformed.  DIAGNOSIS Diagnosis may include:  Physical exam.  X-ray. TREATMENT Treatment depends on many factors, including the nature and location of the fracture, your age, and your activity level. Treatment for wrist fracture can be nonsurgical or surgical.  Nonsurgical Treatment A plaster cast or splint may be applied to your wrist if the bone is in a good position. If the fracture is not in good position, it may be necessary for your health care provider to realign it before applying a splint or cast. Usually, a cast or splint will be worn for several weeks.  Surgical Treatment Sometimes the position of the bone is so far out of place that surgery is required to apply a device to hold it together as it heals. Depending on the fracture, there are a number of options for holding the bone in place while it heals, such as a cast and metal pins.  HOME CARE INSTRUCTIONS  Keep your injured wrist elevated and move your fingers as much as  possible.  Do not put pressure on any part of your cast or splint. It may break.   Use a plastic bag to protect your cast or splint from water while bathing or showering. Do not lower your cast or splint into water.  Take medicines only as directed by your health care provider.  Keep your cast or splint clean and dry. If it becomes wet, damaged, or suddenly feels too tight, contact your health care provider right away.  Do not use any tobacco products including cigarettes, chewing tobacco, or electronic cigarettes. Tobacco can delay bone healing. If you need help quitting, ask your health care provider.  Keep all follow-up visits as directed by your health care provider. This is important.  Ask your health care provider if you should take supplements of calcium and vitamins C and D to promote bone healing. SEEK MEDICAL CARE IF:   Your cast or splint is damaged, breaks, or gets wet.  You have a fever.  You have chills.  You have continued severe pain or more swelling than you did before the cast was put on. SEEK IMMEDIATE MEDICAL CARE IF:   Your hand or fingernails on the injured arm turn blue or gray, or feel cold or numb.  You have decreased feeling in the fingers of your injured arm. MAKE SURE YOU:  Understand these instructions.  Will watch your condition.  Will get help right away if you are not doing well or get worse. Document Released: 09/11/2005 Document Revised:   04/18/2014 Document Reviewed: 12/20/2011 Exit[employer] Patient Information 2015 [employer]. This information is not intended to replace advice given to you by your health care provider. Make sure you discuss any questions you have with your health care provider.  

## 2014-09-12 NOTE — ED Provider Notes (Signed)
Medical screening examination/treatment/procedure(s) were performed by non-physician practitioner and as supervising physician I was immediately available for consultation/collaboration.   EKG Interpretation None       Glynn Octave, MD 09/12/14 1357

## 2014-09-25 ENCOUNTER — Encounter (HOSPITAL_COMMUNITY): Payer: Self-pay | Admitting: Emergency Medicine

## 2014-09-25 ENCOUNTER — Emergency Department (HOSPITAL_COMMUNITY)
Admission: EM | Admit: 2014-09-25 | Discharge: 2014-09-26 | Disposition: A | Discharge status: Home / Self Care | Payer: Medicare Other | Source: Home / Self Care | Attending: Emergency Medicine | Admitting: Emergency Medicine

## 2014-09-25 ENCOUNTER — Emergency Department (HOSPITAL_COMMUNITY): Payer: Medicare Other

## 2014-09-25 DIAGNOSIS — J9690 Respiratory failure, unspecified, unspecified whether with hypoxia or hypercapnia: Secondary | ICD-10-CM | POA: Diagnosis not present

## 2014-09-25 DIAGNOSIS — K8020 Calculus of gallbladder without cholecystitis without obstruction: Secondary | ICD-10-CM

## 2014-09-25 DIAGNOSIS — I8501 Esophageal varices with bleeding: Secondary | ICD-10-CM | POA: Diagnosis not present

## 2014-09-25 DIAGNOSIS — R1110 Vomiting, unspecified: Secondary | ICD-10-CM

## 2014-09-25 LAB — CBC WITH DIFFERENTIAL/PLATELET
BASOS ABS: 0 10*3/uL (ref 0.0–0.1)
Basophils Relative: 0 % (ref 0–1)
EOS ABS: 0.1 10*3/uL (ref 0.0–0.7)
Eosinophils Relative: 2 % (ref 0–5)
HCT: 25.6 % — ABNORMAL LOW (ref 39.0–52.0)
Hemoglobin: 8.4 g/dL — ABNORMAL LOW (ref 13.0–17.0)
LYMPHS ABS: 1.1 10*3/uL (ref 0.7–4.0)
LYMPHS PCT: 23 % (ref 12–46)
MCH: 25.2 pg — ABNORMAL LOW (ref 26.0–34.0)
MCHC: 32.8 g/dL (ref 30.0–36.0)
MCV: 76.9 fL — ABNORMAL LOW (ref 78.0–100.0)
MONOS PCT: 15 % — AB (ref 3–12)
Monocytes Absolute: 0.7 10*3/uL (ref 0.1–1.0)
Neutro Abs: 3 10*3/uL (ref 1.7–7.7)
Neutrophils Relative %: 60 % (ref 43–77)
PLATELETS: 177 10*3/uL (ref 150–400)
RBC: 3.33 MIL/uL — ABNORMAL LOW (ref 4.22–5.81)
RDW: 21.3 % — AB (ref 11.5–15.5)
WBC: 4.9 10*3/uL (ref 4.0–10.5)

## 2014-09-25 LAB — COMPREHENSIVE METABOLIC PANEL
ALT: 17 U/L (ref 0–53)
ANION GAP: 12 (ref 5–15)
AST: 47 U/L — ABNORMAL HIGH (ref 0–37)
Albumin: 3.1 g/dL — ABNORMAL LOW (ref 3.5–5.2)
Alkaline Phosphatase: 199 U/L — ABNORMAL HIGH (ref 39–117)
BUN: 9 mg/dL (ref 6–23)
CALCIUM: 8.4 mg/dL (ref 8.4–10.5)
CO2: 28 meq/L (ref 19–32)
CREATININE: 0.64 mg/dL (ref 0.50–1.35)
Chloride: 94 mEq/L — ABNORMAL LOW (ref 96–112)
GFR calc Af Amer: >90 mL/min (ref >90)
GFR calc non Af Amer: >90 mL/min (ref >90)
GLUCOSE: 92 mg/dL (ref 70–99)
Potassium: 4.4 mEq/L (ref 3.7–5.3)
Sodium: 134 mEq/L — ABNORMAL LOW (ref 137–147)
Total Bilirubin: 1.4 mg/dL — ABNORMAL HIGH (ref 0.3–1.2)
Total Protein: 7.1 g/dL (ref 6.0–8.3)

## 2014-09-25 LAB — URINALYSIS, ROUTINE W REFLEX MICROSCOPIC
Bilirubin Urine: NEGATIVE
Glucose, UA: NEGATIVE mg/dL
HGB URINE DIPSTICK: NEGATIVE
KETONES UR: NEGATIVE mg/dL
Leukocytes, UA: NEGATIVE
Nitrite: NEGATIVE
PROTEIN: NEGATIVE mg/dL
Specific Gravity, Urine: 1.008 (ref 1.005–1.030)
Urobilinogen, UA: 1 mg/dL (ref 0.0–1.0)
pH: 6.5 (ref 5.0–8.0)

## 2014-09-25 LAB — ETHANOL: Alcohol, Ethyl (B): 157 mg/dL — ABNORMAL HIGH (ref 0–11)

## 2014-09-25 LAB — LIPASE, BLOOD: LIPASE: 30 U/L (ref 11–59)

## 2014-09-25 MED ORDER — ONDANSETRON HCL 4 MG/2ML IJ SOLN
4.0000 mg | Freq: Once | INTRAMUSCULAR | Status: AC
  Administered 2014-09-25: 4 mg via INTRAVENOUS
  Filled 2014-09-25: qty 2

## 2014-09-25 MED ORDER — SODIUM CHLORIDE 0.9 % IV BOLUS (SEPSIS)
1000.0000 mL | Freq: Once | INTRAVENOUS | Status: AC
  Administered 2014-09-25: 1000 mL via INTRAVENOUS

## 2014-09-25 NOTE — ED Notes (Signed)
Patient complaining of vomiting with blood in emesis. Patient has heavy ETOH on board.

## 2014-09-25 NOTE — ED Provider Notes (Signed)
CSN: [id number]     Arrival date & time 09/25/14  2211 History   First MD Initiated Contact with Patient 09/25/14 2235     Chief Complaint  Patient presents with  . Emesis  . Alcohol Intoxication     (Consider location/radiation/quality/duration/timing/severity/associated sxs/prior Treatment) HPI Comments: The patient is a 68 year old male with a history of severe alcohol abuse, alcoholic cirrhosis and known esophageal varices with portal gastropathy. He was admitted 3 months ago with a Mallory-Weiss tear and upper GI bleeding, this resolved after endoscopy with endoscopic treatment. He has continued to drink heavily and states that this evening he had acute onset of nausea vomiting while he was on the commode, he had 2 episodes of vomiting the first which did not look like blood and the second which contained large amounts of blood. He then went to sleep for several hours and at this time states that he still feels nauseated but has had no more vomiting of blood. He denies any back pain or swelling of the legs but does endorse having right upper quadrant tenderness. He has had normal small bowel movements without any blood in the bowels.  Patient is a 68 y.o. male presenting with vomiting and intoxication. The history is provided by the patient and medical records.  Emesis Alcohol Intoxication    Past Medical History  Diagnosis Date  . COPD (chronic obstructive pulmonary disease)   . Acid reflux   . IBS (irritable bowel syndrome)   . Chronic back pain   . Arthritis   . Collapsed lung    Past Surgical History  Procedure Laterality Date  . Skin graft    . Skin graft    . Esophagogastroduodenoscopy N/A 07/04/2014    Procedure: ESOPHAGOGASTRODUODENOSCOPY (EGD);  Surgeon: Shirley FriarVincent C. Schooler, MD;  Location: [facility];  Service: Endoscopy;  Laterality: N/A;   History reviewed. No pertinent family history. History  Substance Use Topics  . Smoking status: Current Every Day Smoker --  0.50 packs/day    Types: Cigarettes  . Smokeless tobacco: Not on file  . Alcohol Use: 21.0 oz/week    35 Cans of beer per week    Review of Systems  Gastrointestinal: Positive for vomiting.  All other systems reviewed and are negative.     Allergies  Review of patient's allergies indicates no known allergies.  Home Medications   Prior to Admission medications   Not on File   BP 150/77  Pulse 106  Temp(Src) 98.3 F (36.8 C) (Oral)  Ht 5\' 8"  (1.727 m)  Wt 145 lb (65.772 kg)  BMI 22.05 kg/m2  SpO2 96% Physical Exam  Nursing note and vitals reviewed. Constitutional: He appears well-developed and well-nourished. No distress.  HENT:  Head: Normocephalic and atraumatic.  Mouth/Throat: Oropharynx is clear and moist. No oropharyngeal exudate.  Eyes: Conjunctivae and EOM are normal. Pupils are equal, round, and reactive to light. Right eye exhibits no discharge. Left eye exhibits no discharge. No scleral icterus.  Neck: Normal range of motion. Neck supple. No JVD present. No thyromegaly present.  Cardiovascular: Regular rhythm, normal heart sounds and intact distal pulses.  Exam reveals no gallop and no friction rub.   No murmur heard. Mildly tachycardic  Pulmonary/Chest: Effort normal and breath sounds normal. No respiratory distress. He has no wheezes. He has no rales.  Abdominal: Soft. Bowel sounds are normal. He exhibits distension (abdomen is mildly distended with mild tympanitic sounds to percussion). He exhibits no mass. There is tenderness ( Focal right upper  quadrant tenderness).  Musculoskeletal: Normal range of motion. He exhibits no edema and no tenderness.  Lymphadenopathy:    He has no cervical adenopathy.  Neurological: He is alert. Coordination normal.  Skin: Skin is warm and dry. No rash noted. No erythema.  Psychiatric: He has a normal mood and affect. His behavior is normal.    ED Course  Procedures (including critical care time) Labs Review Labs  Reviewed  COMPREHENSIVE METABOLIC PANEL - Abnormal; Notable for the following:    Sodium 134 (*)    Chloride 94 (*)    Albumin 3.1 (*)    AST 47 (*)    Alkaline Phosphatase 199 (*)    Total Bilirubin 1.4 (*)    All other components within normal limits  CBC WITH DIFFERENTIAL - Abnormal; Notable for the following:    RBC 3.33 (*)    Hemoglobin 8.4 (*)    HCT 25.6 (*)    MCV 76.9 (*)    MCH 25.2 (*)    RDW 21.3 (*)    Monocytes Relative 15 (*)    All other components within normal limits  ETHANOL - Abnormal; Notable for the following:    Alcohol, Ethyl (B) 157 (*)    All other components within normal limits  LIPASE, BLOOD  URINALYSIS, ROUTINE W REFLEX MICROSCOPIC  URINE RAPID DRUG SCREEN (HOSP PERFORMED)    Imaging Review Dg Chest Port 1 View  09/25/2014   CLINICAL DATA:  Vomiting blood. Shortness of breath. Initial encounter  EXAM: PORTABLE CHEST - 1 VIEW  COMPARISON:  07/04/2014  FINDINGS: Normal heart size and mediastinal contours. There is chronic pulmonary hyperinflation and interstitial coarsening. Linear opacity at the left base is chronic and consistent with mild scarring. There is no edema, consolidation, effusion, or pneumothorax (skin folds noted). Remote bilateral rib fractures. No acute osseous findings.  IMPRESSION: Chronic bronchitic changes and left basilar scarring.   Electronically Signed   By: Tiburcio PeaJonathan  Watts M.D.   On: 09/25/2014 23:32     EKG Interpretation None      MDM   Final diagnoses:  Vomiting    The patient has had recurrent upper GI bleeding, the etiology is unclear however the patient had this happen after he had been vomiting which raises suspicion for a Mallory-Weiss tear, less likely to be Boerhaave's given the patient has no chest pain, will obtain a chest x-ray while labs are pending.  Labs results look good and stable - needs to have US RUQ, change of shift care signed out to Dr. Geni BersAllen  Kardell Virgil D Marrio Scribner, MD 09/26/14 [phone]

## 2014-09-25 NOTE — ED Notes (Signed)
Patient stated he is vomiting blood.

## 2014-09-26 ENCOUNTER — Emergency Department (HOSPITAL_COMMUNITY): Payer: Medicare Other

## 2014-09-26 LAB — RAPID URINE DRUG SCREEN, HOSP PERFORMED
Amphetamines: NOT DETECTED
BARBITURATES: NOT DETECTED
Benzodiazepines: NOT DETECTED
COCAINE: NOT DETECTED
OPIATES: NOT DETECTED
TETRAHYDROCANNABINOL: NOT DETECTED

## 2014-09-26 NOTE — Discharge Instructions (Signed)

## 2014-09-26 NOTE — ED Provider Notes (Signed)
Patient signed out to me by Dr. Hyacinth MeekerMiller and ultrasound reviewed with patient which shows gallstones without evidence of cholecystitis. Will be given surgical referral  Toy BakerAnthony T Paymon Rosensteel, MD 09/26/14 [phone]

## 2014-09-27 ENCOUNTER — Inpatient Hospital Stay (HOSPITAL_COMMUNITY): Payer: Medicare Other

## 2014-09-27 ENCOUNTER — Encounter (HOSPITAL_COMMUNITY): Payer: Self-pay | Admitting: Emergency Medicine

## 2014-09-27 ENCOUNTER — Emergency Department (HOSPITAL_COMMUNITY): Payer: Medicare Other

## 2014-09-27 ENCOUNTER — Inpatient Hospital Stay (HOSPITAL_COMMUNITY)
Admission: EM | Admit: 2014-09-27 | Discharge: 2014-10-16 | DRG: 326 | Disposition: E | Payer: Medicare Other | Attending: Emergency Medicine | Admitting: Emergency Medicine

## 2014-09-27 DIAGNOSIS — J449 Chronic obstructive pulmonary disease, unspecified: Secondary | ICD-10-CM | POA: Diagnosis present

## 2014-09-27 DIAGNOSIS — G9341 Metabolic encephalopathy: Secondary | ICD-10-CM | POA: Diagnosis present

## 2014-09-27 DIAGNOSIS — Z9119 Patient's noncompliance with other medical treatment and regimen: Secondary | ICD-10-CM | POA: Diagnosis present

## 2014-09-27 DIAGNOSIS — K589 Irritable bowel syndrome without diarrhea: Secondary | ICD-10-CM | POA: Diagnosis present

## 2014-09-27 DIAGNOSIS — E872 Acidosis: Secondary | ICD-10-CM | POA: Diagnosis present

## 2014-09-27 DIAGNOSIS — K3189 Other diseases of stomach and duodenum: Secondary | ICD-10-CM | POA: Diagnosis present

## 2014-09-27 DIAGNOSIS — I8501 Esophageal varices with bleeding: Secondary | ICD-10-CM | POA: Diagnosis present

## 2014-09-27 DIAGNOSIS — R578 Other shock: Secondary | ICD-10-CM | POA: Diagnosis present

## 2014-09-27 DIAGNOSIS — J9690 Respiratory failure, unspecified, unspecified whether with hypoxia or hypercapnia: Secondary | ICD-10-CM

## 2014-09-27 DIAGNOSIS — R579 Shock, unspecified: Secondary | ICD-10-CM

## 2014-09-27 DIAGNOSIS — E861 Hypovolemia: Secondary | ICD-10-CM | POA: Diagnosis present

## 2014-09-27 DIAGNOSIS — E162 Hypoglycemia, unspecified: Secondary | ICD-10-CM | POA: Diagnosis present

## 2014-09-27 DIAGNOSIS — F1020 Alcohol dependence, uncomplicated: Secondary | ICD-10-CM | POA: Diagnosis present

## 2014-09-27 DIAGNOSIS — I469 Cardiac arrest, cause unspecified: Secondary | ICD-10-CM | POA: Diagnosis not present

## 2014-09-27 DIAGNOSIS — D689 Coagulation defect, unspecified: Secondary | ICD-10-CM | POA: Diagnosis present

## 2014-09-27 DIAGNOSIS — T68XXXA Hypothermia, initial encounter: Secondary | ICD-10-CM | POA: Diagnosis present

## 2014-09-27 DIAGNOSIS — I4891 Unspecified atrial fibrillation: Secondary | ICD-10-CM | POA: Diagnosis present

## 2014-09-27 DIAGNOSIS — R188 Other ascites: Secondary | ICD-10-CM | POA: Diagnosis present

## 2014-09-27 DIAGNOSIS — K7290 Hepatic failure, unspecified without coma: Secondary | ICD-10-CM | POA: Diagnosis present

## 2014-09-27 DIAGNOSIS — K7460 Unspecified cirrhosis of liver: Secondary | ICD-10-CM | POA: Diagnosis present

## 2014-09-27 DIAGNOSIS — Z66 Do not resuscitate: Secondary | ICD-10-CM

## 2014-09-27 DIAGNOSIS — F1010 Alcohol abuse, uncomplicated: Secondary | ICD-10-CM

## 2014-09-27 DIAGNOSIS — K219 Gastro-esophageal reflux disease without esophagitis: Secondary | ICD-10-CM | POA: Diagnosis present

## 2014-09-27 DIAGNOSIS — B888 Other specified infestations: Secondary | ICD-10-CM | POA: Diagnosis present

## 2014-09-27 DIAGNOSIS — K7030 Alcoholic cirrhosis of liver without ascites: Secondary | ICD-10-CM | POA: Diagnosis present

## 2014-09-27 DIAGNOSIS — I10 Essential (primary) hypertension: Secondary | ICD-10-CM | POA: Diagnosis present

## 2014-09-27 DIAGNOSIS — J9602 Acute respiratory failure with hypercapnia: Secondary | ICD-10-CM

## 2014-09-27 DIAGNOSIS — D62 Acute posthemorrhagic anemia: Secondary | ICD-10-CM | POA: Diagnosis present

## 2014-09-27 DIAGNOSIS — F17210 Nicotine dependence, cigarettes, uncomplicated: Secondary | ICD-10-CM | POA: Diagnosis present

## 2014-09-27 DIAGNOSIS — K922 Gastrointestinal hemorrhage, unspecified: Secondary | ICD-10-CM | POA: Diagnosis not present

## 2014-09-27 DIAGNOSIS — S5292XD Unspecified fracture of left forearm, subsequent encounter for closed fracture with routine healing: Secondary | ICD-10-CM

## 2014-09-27 DIAGNOSIS — R571 Hypovolemic shock: Secondary | ICD-10-CM | POA: Diagnosis present

## 2014-09-27 DIAGNOSIS — Z978 Presence of other specified devices: Secondary | ICD-10-CM

## 2014-09-27 DIAGNOSIS — G931 Anoxic brain damage, not elsewhere classified: Secondary | ICD-10-CM | POA: Diagnosis not present

## 2014-09-27 DIAGNOSIS — K920 Hematemesis: Secondary | ICD-10-CM | POA: Diagnosis present

## 2014-09-27 DIAGNOSIS — K7031 Alcoholic cirrhosis of liver with ascites: Secondary | ICD-10-CM

## 2014-09-27 DIAGNOSIS — F17200 Nicotine dependence, unspecified, uncomplicated: Secondary | ICD-10-CM | POA: Diagnosis present

## 2014-09-27 DIAGNOSIS — J9601 Acute respiratory failure with hypoxia: Secondary | ICD-10-CM | POA: Diagnosis present

## 2014-09-27 DIAGNOSIS — K226 Gastro-esophageal laceration-hemorrhage syndrome: Secondary | ICD-10-CM | POA: Diagnosis present

## 2014-09-27 DIAGNOSIS — D638 Anemia in other chronic diseases classified elsewhere: Secondary | ICD-10-CM

## 2014-09-27 LAB — CBC
HCT: 21 % — ABNORMAL LOW (ref 39.0–52.0)
Hemoglobin: 6.2 g/dL — CL (ref 13.0–17.0)
MCH: 28.8 pg (ref 26.0–34.0)
MCHC: 29.5 g/dL — ABNORMAL LOW (ref 30.0–36.0)
MCV: 97.7 fL (ref 78.0–100.0)
PLATELETS: 53 10*3/uL — AB (ref 150–400)
RBC: 2.15 MIL/uL — AB (ref 4.22–5.81)
RDW: 16.8 % — AB (ref 11.5–15.5)
WBC: 3.9 10*3/uL — ABNORMAL LOW (ref 4.0–10.5)

## 2014-09-27 LAB — COMPREHENSIVE METABOLIC PANEL
ALK PHOS: 144 U/L — AB (ref 39–117)
ALT: 289 U/L — AB (ref 0–53)
AST: 1339 U/L — AB (ref 0–37)
Albumin: 2.3 g/dL — ABNORMAL LOW (ref 3.5–5.2)
Anion gap: 41 — ABNORMAL HIGH (ref 5–15)
BILIRUBIN TOTAL: 2.1 mg/dL — AB (ref 0.3–1.2)
BUN: 21 mg/dL (ref 6–23)
CHLORIDE: 88 meq/L — AB (ref 96–112)
CO2: 10 meq/L — AB (ref 19–32)
Calcium: 8.3 mg/dL — ABNORMAL LOW (ref 8.4–10.5)
Creatinine, Ser: 1.11 mg/dL (ref 0.50–1.35)
GFR, EST AFRICAN AMERICAN: 78 mL/min — AB (ref >90)
GFR, EST NON AFRICAN AMERICAN: 67 mL/min — AB (ref >90)
GLUCOSE: 31 mg/dL — AB (ref 70–99)
POTASSIUM: 4 meq/L (ref 3.7–5.3)
SODIUM: 139 meq/L (ref 137–147)
Total Protein: 5.3 g/dL — ABNORMAL LOW (ref 6.0–8.3)

## 2014-09-27 LAB — CBC WITH DIFFERENTIAL/PLATELET
BASOS ABS: 0 10*3/uL (ref 0.0–0.1)
BASOS PCT: 0 % (ref 0–1)
Basophils Absolute: 0 10*3/uL (ref 0.0–0.1)
Basophils Relative: 0 % (ref 0–1)
EOS ABS: 0 10*3/uL (ref 0.0–0.7)
EOS PCT: 0 % (ref 0–5)
Eosinophils Absolute: 0 10*3/uL (ref 0.0–0.7)
Eosinophils Relative: 0 % (ref 0–5)
HCT: 16 % — ABNORMAL LOW (ref 39.0–52.0)
HCT: 38 % — ABNORMAL LOW (ref 39.0–52.0)
HEMOGLOBIN: 4.8 g/dL — AB (ref 13.0–17.0)
HEMOGLOBIN: 11.7 g/dL — AB (ref 13.0–17.0)
LYMPHS PCT: 9 % — AB (ref 12–46)
Lymphocytes Relative: 7 % — ABNORMAL LOW (ref 12–46)
Lymphs Abs: 0.4 10*3/uL — ABNORMAL LOW (ref 0.7–4.0)
Lymphs Abs: 1.2 10*3/uL (ref 0.7–4.0)
MCH: 25.1 pg — AB (ref 26.0–34.0)
MCH: 29.5 pg (ref 26.0–34.0)
MCHC: 30 g/dL (ref 30.0–36.0)
MCHC: 30.8 g/dL (ref 30.0–36.0)
MCV: 83.8 fL (ref 78.0–100.0)
MCV: 95.7 fL (ref 78.0–100.0)
MONO ABS: 1.2 10*3/uL — AB (ref 0.1–1.0)
MONOS PCT: 16 % — AB (ref 3–12)
Monocytes Absolute: 0.8 10*3/uL (ref 0.1–1.0)
Monocytes Relative: 9 % (ref 3–12)
NEUTROS PCT: 77 % (ref 43–77)
Neutro Abs: 4.1 10*3/uL (ref 1.7–7.7)
Neutro Abs: 10.8 10*3/uL — ABNORMAL HIGH (ref 1.7–7.7)
Neutrophils Relative %: 82 % — ABNORMAL HIGH (ref 43–77)
PLATELETS: 156 10*3/uL (ref 150–400)
Platelets: 52 10*3/uL — ABNORMAL LOW (ref 150–400)
RBC: 1.91 MIL/uL — AB (ref 4.22–5.81)
RBC: 3.97 MIL/uL — ABNORMAL LOW (ref 4.22–5.81)
RDW: 22.5 % — ABNORMAL HIGH (ref 11.5–15.5)
RDW: 15.2 % (ref 11.5–15.5)
WBC Morphology: INCREASED
WBC: 5.3 10*3/uL (ref 4.0–10.5)
WBC: 13.2 10*3/uL — ABNORMAL HIGH (ref 4.0–10.5)

## 2014-09-27 LAB — BLOOD GAS, ARTERIAL
Acid-base deficit: 26.7 mmol/L — ABNORMAL HIGH (ref 0.0–2.0)
Bicarbonate: 4 mEq/L — ABNORMAL LOW (ref 20.0–24.0)
DRAWN BY: 39899
FIO2: 0.4 %
LHR: 35 {breaths}/min
O2 Saturation: 90.3 %
PCO2 ART: 25 mmHg — AB (ref 35.0–45.0)
PEEP: 5 cmH2O
PH ART: 6.831 — AB (ref 7.350–7.450)
PO2 ART: 82.1 mmHg (ref 80.0–100.0)
Patient temperature: 98.6
TCO2: 4.7 mmol/L (ref 0–100)
VT: 500 mL

## 2014-09-27 LAB — I-STAT CHEM 8, ED
BUN: 20 mg/dL (ref 6–23)
CHLORIDE: 93 meq/L — AB (ref 96–112)
CREATININE: 1.3 mg/dL (ref 0.50–1.35)
Calcium, Ion: 0.93 mmol/L — ABNORMAL LOW (ref 1.13–1.30)
Glucose, Bld: 30 mg/dL — CL (ref 70–99)
HCT: 18 % — ABNORMAL LOW (ref 39.0–52.0)
Hemoglobin: 6.1 g/dL — CL (ref 13.0–17.0)
POTASSIUM: 3.8 meq/L (ref 3.7–5.3)
SODIUM: 134 meq/L — AB (ref 137–147)
TCO2: 11 mmol/L (ref 0–100)

## 2014-09-27 LAB — GLUCOSE, CAPILLARY
GLUCOSE-CAPILLARY: 53 mg/dL — AB (ref 70–99)
Glucose-Capillary: 150 mg/dL — ABNORMAL HIGH (ref 70–99)
Glucose-Capillary: 119 mg/dL — ABNORMAL HIGH (ref 70–99)

## 2014-09-27 LAB — URINE MICROSCOPIC-ADD ON

## 2014-09-27 LAB — I-STAT CG4 LACTIC ACID, ED: Lactic Acid, Venous: >17 mmol/L — ABNORMAL HIGH (ref 0.5–2.2)

## 2014-09-27 LAB — BASIC METABOLIC PANEL
BUN: 19 mg/dL (ref 6–23)
CHLORIDE: 99 meq/L (ref 96–112)
Calcium: 7.8 mg/dL — ABNORMAL LOW (ref 8.4–10.5)
Creatinine, Ser: 1.08 mg/dL (ref 0.50–1.35)
GFR calc Af Amer: 81 mL/min — ABNORMAL LOW (ref >90)
GFR calc non Af Amer: 70 mL/min — ABNORMAL LOW (ref >90)
Glucose, Bld: 127 mg/dL — ABNORMAL HIGH (ref 70–99)
POTASSIUM: 5.4 meq/L — AB (ref 3.7–5.3)
Sodium: 141 mEq/L (ref 137–147)

## 2014-09-27 LAB — POCT I-STAT 3, ART BLOOD GAS (G3+)
ACID-BASE DEFICIT: 30 mmol/L — AB (ref 0.0–2.0)
Acid-base deficit: 30 mmol/L — ABNORMAL HIGH (ref 0.0–2.0)
Bicarbonate: 4.4 mEq/L — ABNORMAL LOW (ref 20.0–24.0)
Bicarbonate: 4.7 mEq/L — ABNORMAL LOW (ref 20.0–24.0)
O2 SAT: 100 %
O2 SAT: 99 %
PH ART: 6.757 — AB (ref 7.350–7.450)
TCO2: 6 mmol/L (ref 0–100)
TCO2: 6 mmol/L (ref 0–100)
pCO2 arterial: 41.8 mmHg (ref 35.0–45.0)
pCO2 arterial: 33.2 mmHg — ABNORMAL LOW (ref 35.0–45.0)
pH, Arterial: 6.632 — CL (ref 7.350–7.450)
pO2, Arterial: 487 mmHg — ABNORMAL HIGH (ref 80.0–100.0)
pO2, Arterial: 274 mmHg — ABNORMAL HIGH (ref 80.0–100.0)

## 2014-09-27 LAB — APTT
aPTT: 86 seconds — ABNORMAL HIGH (ref 24–37)
aPTT: 62 seconds — ABNORMAL HIGH (ref 24–37)

## 2014-09-27 LAB — PREPARE RBC (CROSSMATCH)

## 2014-09-27 LAB — CORTISOL: Cortisol, Plasma: 48.8 ug/dL

## 2014-09-27 LAB — PROTIME-INR
INR: 3.81 — ABNORMAL HIGH (ref 0.00–1.49)
INR: 2.84 — AB (ref 0.00–1.49)
Prothrombin Time: 37.5 seconds — ABNORMAL HIGH (ref 11.6–15.2)
Prothrombin Time: 29.8 seconds — ABNORMAL HIGH (ref 11.6–15.2)

## 2014-09-27 LAB — I-STAT TROPONIN, ED: Troponin i, poc: 0.04 ng/mL (ref 0.00–0.08)

## 2014-09-27 LAB — URINALYSIS, ROUTINE W REFLEX MICROSCOPIC
Bilirubin Urine: NEGATIVE
GLUCOSE, UA: NEGATIVE mg/dL
Ketones, ur: 15 mg/dL — AB
Leukocytes, UA: NEGATIVE
Nitrite: NEGATIVE
Protein, ur: NEGATIVE mg/dL
Specific Gravity, Urine: 1.013 (ref 1.005–1.030)
Urobilinogen, UA: 0.2 mg/dL (ref 0.0–1.0)
pH: 5 (ref 5.0–8.0)

## 2014-09-27 LAB — MAGNESIUM: Magnesium: 1.9 mg/dL (ref 1.5–2.5)

## 2014-09-27 LAB — RAPID URINE DRUG SCREEN, HOSP PERFORMED
Amphetamines: NOT DETECTED
Barbiturates: NOT DETECTED
Benzodiazepines: NOT DETECTED
Cocaine: NOT DETECTED
Opiates: NOT DETECTED
TETRAHYDROCANNABINOL: NOT DETECTED

## 2014-09-27 LAB — AMMONIA: Ammonia: 105 umol/L — ABNORMAL HIGH (ref 11–60)

## 2014-09-27 LAB — CBG MONITORING, ED
Glucose-Capillary: 90 mg/dL (ref 70–99)
Glucose-Capillary: 99 mg/dL (ref 70–99)

## 2014-09-27 LAB — LIPASE, BLOOD: Lipase: 47 U/L (ref 11–59)

## 2014-09-27 LAB — TRIGLYCERIDES: TRIGLYCERIDES: 291 mg/dL — AB (ref <150)

## 2014-09-27 LAB — LACTIC ACID, PLASMA: LACTIC ACID, VENOUS: 26.1 mmol/L — AB (ref 0.5–2.2)

## 2014-09-27 LAB — PHOSPHORUS: Phosphorus: 8.5 mg/dL — ABNORMAL HIGH (ref 2.3–4.6)

## 2014-09-27 LAB — ETHANOL: Alcohol, Ethyl (B): 29 mg/dL — ABNORMAL HIGH (ref 0–11)

## 2014-09-27 LAB — ABO/RH: ABO/RH(D): A POS

## 2014-09-27 MED ORDER — VASOPRESSIN 20 UNIT/ML IJ SOLN
0.0300 [IU]/min | INTRAVENOUS | Status: DC
  Filled 2014-09-27: qty 2

## 2014-09-27 MED ORDER — LIDOCAINE HCL (CARDIAC) 20 MG/ML IV SOLN
INTRAVENOUS | Status: AC
  Filled 2014-09-27: qty 5

## 2014-09-27 MED ORDER — DEXTROSE 5 % IV SOLN: 1.0000 g | INTRAVENOUS | Status: DC

## 2014-09-27 MED ORDER — DEXTROSE 50 % IV SOLN
INTRAVENOUS | Status: AC
  Administered 2014-09-27: 50 mL
  Filled 2014-09-27: qty 50

## 2014-09-27 MED ORDER — DEXTROSE 50 % IV SOLN
INTRAVENOUS | Status: AC
  Filled 2014-09-27: qty 50

## 2014-09-27 MED ORDER — SODIUM CHLORIDE 0.9 % IV SOLN
1000.0000 mL | Freq: Once | INTRAVENOUS | Status: AC
  Administered 2014-09-27: 1000 mL via INTRAVENOUS

## 2014-09-27 MED ORDER — ARTIFICIAL TEARS OP OINT: 1.0000 "application " | TOPICAL_OINTMENT | Freq: Three times a day (TID) | OPHTHALMIC | Status: DC

## 2014-09-27 MED ORDER — VASOPRESSIN 20 UNIT/ML IJ SOLN
0.4000 [IU]/min | INTRAVENOUS | Status: DC
  Administered 2014-09-27: 0.4 [IU]/min via INTRAVENOUS
  Filled 2014-09-27 (×2): qty 5

## 2014-09-27 MED ORDER — SODIUM CHLORIDE 0.9 % IV SOLN: Freq: Once | INTRAVENOUS | Status: DC

## 2014-09-27 MED ORDER — ONDANSETRON HCL 4 MG/2ML IJ SOLN
4.0000 mg | Freq: Once | INTRAMUSCULAR | Status: AC
  Administered 2014-09-27: 4 mg via INTRAVENOUS
  Filled 2014-09-27: qty 2

## 2014-09-27 MED ORDER — ETOMIDATE 2 MG/ML IV SOLN
INTRAVENOUS | Status: AC
  Filled 2014-09-27: qty 20

## 2014-09-27 MED ORDER — SODIUM CHLORIDE 0.9 % IV SOLN
Freq: Once | INTRAVENOUS | Status: AC
  Administered 2014-09-27: 17:00:00 via INTRAVENOUS

## 2014-09-27 MED ORDER — STERILE WATER FOR INJECTION IV SOLN
Freq: Once | INTRAVENOUS | Status: DC
  Filled 2014-09-27: qty 850

## 2014-09-27 MED ORDER — SUCCINYLCHOLINE CHLORIDE 20 MG/ML IJ SOLN
INTRAMUSCULAR | Status: AC
  Filled 2014-09-27: qty 1

## 2014-09-27 MED ORDER — VECURONIUM BROMIDE 10 MG IV SOLR: 10.0000 mg | Freq: Once | INTRAVENOUS | Status: DC

## 2014-09-27 MED ORDER — FENTANYL CITRATE 0.05 MG/ML IJ SOLN
200.0000 ug | Freq: Once | INTRAMUSCULAR | Status: AC
  Administered 2014-09-27: 200 ug via INTRAVENOUS

## 2014-09-27 MED ORDER — MIDAZOLAM HCL 2 MG/2ML IJ SOLN
4.0000 mg | Freq: Once | INTRAMUSCULAR | Status: AC
  Administered 2014-09-27: 4 mg via INTRAVENOUS

## 2014-09-27 MED ORDER — DEXTROSE 50 % IV SOLN
1.0000 | Freq: Once | INTRAVENOUS | Status: AC
  Administered 2014-09-27: 50 mL via INTRAVENOUS

## 2014-09-27 MED ORDER — FENTANYL CITRATE 0.05 MG/ML IJ SOLN
50.0000 ug | Freq: Once | INTRAMUSCULAR | Status: AC
  Administered 2014-09-27: 50 ug via INTRAVENOUS

## 2014-09-27 MED ORDER — SODIUM CHLORIDE 0.9 % IV SOLN
80.0000 mg | Freq: Once | INTRAVENOUS | Status: AC
  Administered 2014-09-27: 80 mg via INTRAVENOUS
  Filled 2014-09-27: qty 80

## 2014-09-27 MED ORDER — VITAMIN K1 10 MG/ML IJ SOLN
10.0000 mg | Freq: Once | INTRAMUSCULAR | Status: AC
  Administered 2014-09-27: 10 mg via SUBCUTANEOUS
  Filled 2014-09-27: qty 1

## 2014-09-27 MED ORDER — ROCURONIUM BROMIDE 50 MG/5ML IV SOLN
INTRAVENOUS | Status: AC
  Filled 2014-09-27: qty 2

## 2014-09-27 MED ORDER — FENTANYL CITRATE 0.05 MG/ML IJ SOLN
INTRAMUSCULAR | Status: AC
  Administered 2014-09-27: 100 ug
  Filled 2014-09-27: qty 4

## 2014-09-27 MED ORDER — SODIUM CHLORIDE 0.9 % IV SOLN
Freq: Once | INTRAVENOUS | Status: AC
  Administered 2014-09-27: 13:00:00 via INTRAVENOUS

## 2014-09-27 MED ORDER — SODIUM CHLORIDE 0.9 % IV SOLN
INTRAVENOUS | Status: AC | PRN
  Administered 2014-09-27: 999 mL/h via INTRAVENOUS

## 2014-09-27 MED ORDER — SODIUM BICARBONATE 8.4 % IV SOLN
100.0000 meq | Freq: Once | INTRAVENOUS | Status: DC
  Filled 2014-09-27: qty 100

## 2014-09-27 MED ORDER — SODIUM CHLORIDE 0.9 % IV SOLN
1000.0000 mL | INTRAVENOUS | Status: DC
  Administered 2014-09-27: 1000 mL via INTRAVENOUS

## 2014-09-27 MED ORDER — VECURONIUM BROMIDE 10 MG IV SOLR
INTRAVENOUS | Status: AC
  Administered 2014-09-27: 10 mg
  Filled 2014-09-27: qty 10

## 2014-09-27 MED ORDER — EPINEPHRINE HCL 0.1 MG/ML IJ SOSY
PREFILLED_SYRINGE | INTRAMUSCULAR | Status: AC | PRN
  Administered 2014-09-27 (×2): 1 via INTRAVENOUS

## 2014-09-27 MED ORDER — MIDAZOLAM HCL 2 MG/2ML IJ SOLN
INTRAMUSCULAR | Status: AC
  Administered 2014-09-27: 2 mg
  Filled 2014-09-27: qty 4

## 2014-09-27 MED ORDER — CEFTRIAXONE SODIUM 1 G IJ SOLR
1.0000 g | Freq: Once | INTRAMUSCULAR | Status: AC
  Administered 2014-09-27: 1 g via INTRAVENOUS
  Filled 2014-09-27: qty 10

## 2014-09-27 MED ORDER — PROPOFOL 10 MG/ML IV EMUL
0.0000 ug/kg/min | INTRAVENOUS | Status: DC
  Administered 2014-09-27: 10 ug/kg/min via INTRAVENOUS
  Filled 2014-09-27: qty 100

## 2014-09-27 MED ORDER — SODIUM CHLORIDE 0.9 % IV SOLN
INTRAVENOUS | Status: DC
  Administered 2014-09-27: 08:00:00 via INTRAVENOUS

## 2014-09-27 MED ORDER — SODIUM CHLORIDE 0.9 % IV SOLN
50.0000 ug/h | INTRAVENOUS | Status: DC
  Administered 2014-09-27: 50 ug/h via INTRAVENOUS
  Filled 2014-09-27 (×4): qty 1

## 2014-09-27 MED ORDER — FENTANYL CITRATE 0.05 MG/ML IJ SOLN
INTRAMUSCULAR | Status: AC
  Filled 2014-09-27: qty 2

## 2014-09-27 MED ORDER — SODIUM BICARBONATE 8.4 % IV SOLN
INTRAVENOUS | Status: AC
  Filled 2014-09-27: qty 100

## 2014-09-27 MED ORDER — CISATRACURIUM BESYLATE 10 MG/ML IV SOLN
3.0000 ug/kg/min | INTRAVENOUS | Status: DC
  Filled 2014-09-27: qty 20

## 2014-09-27 MED ORDER — CISATRACURIUM BOLUS VIA INFUSION
0.1000 mg/kg | Freq: Once | INTRAVENOUS | Status: DC
  Filled 2014-09-27: qty 7

## 2014-09-27 MED ORDER — SODIUM CHLORIDE 0.9 % IV SOLN: 10.0000 mL/h | Freq: Once | INTRAVENOUS | Status: AC

## 2014-09-27 MED ORDER — SODIUM CHLORIDE 0.9 % IV SOLN: Freq: Once | INTRAVENOUS | Status: AC

## 2014-09-27 MED ORDER — FENTANYL CITRATE 0.05 MG/ML IJ SOLN: 50.0000 ug | INTRAMUSCULAR | Status: DC | PRN

## 2014-09-27 MED ORDER — SODIUM CHLORIDE 0.9 % IV SOLN
8.0000 mg/h | INTRAVENOUS | Status: DC
  Administered 2014-09-27: 8 mg/h via INTRAVENOUS
  Filled 2014-09-27 (×4): qty 80

## 2014-09-27 MED FILL — Medication: Qty: 1 | Status: AC

## 2014-09-28 LAB — TYPE AND SCREEN
ABO/RH(D): A POS
ANTIBODY SCREEN: NEGATIVE
UNIT DIVISION: 0
UNIT DIVISION: 0
UNIT DIVISION: 0
UNIT DIVISION: 0
UNIT DIVISION: 0
Unit division: 0
Unit division: 0
Unit division: 0
Unit division: 0
Unit division: 0
Unit division: 0
Unit division: 0

## 2014-09-28 LAB — PREPARE FRESH FROZEN PLASMA
Unit division: 0
Unit division: 0
Unit division: 0
Unit division: 0

## 2014-09-28 LAB — URINE CULTURE
Colony Count: NO GROWTH
Culture: NO GROWTH

## 2014-09-28 NOTE — Discharge Summary (Signed)
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 He has continued to drink and presents to [facility] ED 10/13 with hematemesis, elevated INR, shock where he was transiently stabilized with IVFs, blood products and PCCM was asked to admit to ICU with GI consultation.   HOSPITAL COURSE:   He was seen for admission to the ICU by the PCCM service. At the time of initial evaluation, he had been seemingly well stabilized by the resuscitation efforts in the ED. However, before transport he developed recurrent massive hematemesis and shock that proceeded to bradycardic PEA arrest. He was intubated and resuscitated with epinephrine and more volume. He was transported to the ICU where he was never fully stabilized despite efforts by Dr Tyson [name] which included placement of a [place] tube. Please refer to the progress notes for detailed documentation of Dr Gwendolyn GrantFeinstein's heroic efforts. He was never sufficiently stabilized to undergo EGD. He suffered cardiac arrest on 2 more occasions during the day. After the last round of ACLS, he was made DNR as it was evident that his  outcome would not be favorable. His blood pressure progressively declined until he again suffered asystole. He was pronounced dead shortly thereafter.    Cause of death:  Hemorrhagic shock Massive UGIB Alcoholic cirrhosis  Contributing factors: Coagulopathy  Autopsy:  No  Smoker:  Yes  Onalee Huaavid B. Sung AmabileSimonds, MD

## 2014-10-01 LAB — CULTURE, BLOOD (ROUTINE X 2)

## 2014-10-03 LAB — CULTURE, BLOOD (ROUTINE X 2): Culture: NO GROWTH

## 2014-10-16 NOTE — ED Provider Notes (Signed)
CSN: [id number]     Arrival date & time October 19, 2014  0608 History   First MD Initiated Contact with Patient 10/19/2014 [phone]     Chief Complaint  Patient presents with  . Vomiting  . Atrial Fibrillation  . GI Bleeding     (Consider location/radiation/quality/duration/timing/severity/associated sxs/prior Treatment) HPI  68 year old male presents to emergency room via EMS with reported GI bleed.  Patient reports that he has had nausea and vomiting after being discharged from [facility] yesterday.  He has had persistent vomiting of both right rib blood and coffee ground emesis.  Patient has been drinking today.  Patient was diagnosed with gallstones and ED visit yesterday and told to followup.  Patient is an alcoholic history of cirrhosis, esophageal varices.  He had admission in July for hemorrhagic shock and Mallory-Weiss tear.  Patient complaining of diffuse abdominal pain worse periumbilical and right upper quadrant.  Patient was hypotensive in route with reported blood pressure 50 over palp.  Patient was tachycardic into the 180s.  Patient received IV fluids.  Patient is a poor historian secondary to current illness.  Note reviewed from yesterday's ED visit, at that time he reported that he was drinking heavily and had acute onset of nausea vomiting, 2 episodes, the second having a large amount of blood.  While in the emergency department he had no further vomiting. Past Medical History  Diagnosis Date  . COPD (chronic obstructive pulmonary disease)   . Acid reflux   . IBS (irritable bowel syndrome)   . Chronic back pain   . Arthritis   . Collapsed lung    Past Surgical History  Procedure Laterality Date  . Skin graft    . Skin graft    . Esophagogastroduodenoscopy N/A 07/04/2014    Procedure: ESOPHAGOGASTRODUODENOSCOPY (EGD);  Surgeon: Shirley Friar, MD;  Location: [facility];  Service: Endoscopy;  Laterality: N/A;   History reviewed. No pertinent family history. History   Substance Use Topics  . Smoking status: Current Every Day Smoker -- 0.50 packs/day    Types: Cigarettes  . Smokeless tobacco: Not on file  . Alcohol Use: 21.0 oz/week    35 Cans of beer per week    Review of Systems  Unable to perform ROS: Acuity of condition      Allergies  Review of patient's allergies indicates no known allergies.  Home Medications   Prior to Admission medications   Not on File   BP 102/41  Pulse 159  Temp(Src) 97.9 F (36.6 C) (Oral)  Resp 23  SpO2 93% Physical Exam  Nursing note and vitals reviewed. Constitutional: He is oriented to person, place, and time. He appears distressed.  Patient is pale, grey yellow in appearance  HENT:  Head: Normocephalic and atraumatic.  Coffee-ground emesis around his mouth and staining his lips and tongue  Eyes: Conjunctivae and EOM are normal. Pupils are equal, round, and reactive to light. Scleral icterus is present.  Neck: Normal range of motion. Neck supple. JVD present. No tracheal deviation present. No thyromegaly present.  Cardiovascular: Normal heart sounds and intact distal pulses.  Exam reveals no gallop and no friction rub.   No murmur heard. Tachycardia with irregular rate  Pulmonary/Chest: Effort normal and breath sounds normal. No stridor. No respiratory distress. He has no wheezes. He has no rales. He exhibits no tenderness.  Abdominal: Soft. He exhibits distension (the patient has moderate distention of his abdomen, diffuse tenderness worse in right upper quadrant). He exhibits no mass. There is  tenderness. There is no rebound and no guarding.  Musculoskeletal: He exhibits no edema and no tenderness.  Left arm is in a splint, reports wrist fracture  Lymphadenopathy:    He has no cervical adenopathy.  Neurological: He is alert and oriented to person, place, and time.  Skin: He is diaphoretic. There is pallor.  Psychiatric:  The patient has poor insight and judgment into his disease, and does not  appear to understand how ill he is    ED Course  CENTRAL LINE Date/Time: 04-Aug-2014 8:10 AM Performed by: Olivia MackieTTER, Dream Harman M Authorized by: Olivia MackieTTER, Delmi Fulfer M Consent: Verbal consent obtained. The procedure was performed in an emergent situation. Required items: required blood products, implants, devices, and special equipment available Patient identity confirmed: verbally with patient Time out: Immediately prior to procedure a "time out" was called to verify the correct patient, procedure, equipment, support staff and site/side marked as required. Indications: vascular access Anesthesia: local infiltration Local anesthetic: lidocaine 1% without epinephrine Anesthetic total: 2 ml Patient sedated: no Preparation: skin prepped with 2% chlorhexidine Skin prep agent dried: skin prep agent completely dried prior to procedure Sterile barriers: all five maximum sterile barriers used - cap, mask, sterile gown, sterile gloves, and large sterile sheet Hand hygiene: hand hygiene performed prior to central venous catheter insertion Location details: right internal jugular Site selection rationale: safett Patient position: Trendelenburg Catheter type: triple lumen Catheter size: 7 Fr Pre-procedure: landmarks identified Ultrasound guidance: yes Number of attempts: 3 Successful placement: yes Post-procedure: line sutured Assessment: blood return through all ports and placement verified by x-ray Patient tolerance: Patient tolerated the procedure well with no immediate complications.   (including critical care time) Labs Review Labs Reviewed  AMMONIA - Abnormal; Notable for the following:    Ammonia 105 (*)    All other components within normal limits  CBC WITH DIFFERENTIAL - Abnormal; Notable for the following:    RBC 1.91 (*)    Hemoglobin 4.8 (*)    HCT 16.0 (*)    MCH 25.1 (*)    RDW 22.5 (*)    Lymphocytes Relative 7 (*)    Monocytes Relative 16 (*)    Lymphs Abs 0.4 (*)    All other  components within normal limits  COMPREHENSIVE METABOLIC PANEL - Abnormal; Notable for the following:    Chloride 88 (*)    CO2 10 (*)    Glucose, Bld 31 (*)    Calcium 8.3 (*)    Total Protein 5.3 (*)    Albumin 2.3 (*)    AST 1339 (*)    ALT 289 (*)    Alkaline Phosphatase 144 (*)    Total Bilirubin 2.1 (*)    GFR calc non Af Amer 67 (*)    GFR calc Af Amer 78 (*)    Anion gap 41 (*)    All other components within normal limits  PROTIME-INR - Abnormal; Notable for the following:    Prothrombin Time 37.5 (*)    INR 3.81 (*)    All other components within normal limits  ETHANOL - Abnormal; Notable for the following:    Alcohol, Ethyl (B) 29 (*)    All other components within normal limits  I-STAT CHEM 8, ED - Abnormal; Notable for the following:    Sodium 134 (*)    Chloride 93 (*)    Glucose, Bld 30 (*)    Calcium, Ion 0.93 (*)    Hemoglobin 6.1 (*)    HCT 18.0 (*)  All other components within normal limits  I-STAT CG4 LACTIC ACID, ED - Abnormal; Notable for the following:    Lactic Acid, Venous >17.00 (*)    All other components within normal limits  CULTURE, BLOOD (ROUTINE X 2)  CULTURE, BLOOD (ROUTINE X 2)  URINE CULTURE  LIPASE, BLOOD  URINALYSIS, ROUTINE W REFLEX MICROSCOPIC  URINE RAPID DRUG SCREEN (HOSP PERFORMED)  MAGNESIUM  PHOSPHORUS  PROCALCITONIN  CORTISOL  APTT  STREP PNEUMONIAE URINARY ANTIGEN  LEGIONELLA ANTIGEN, URINE  BLOOD GAS, ARTERIAL  I-STAT TROPOININ, ED  CBG MONITORING, ED  CBG MONITORING, ED  TYPE AND SCREEN  PREPARE RBC (CROSSMATCH)  ABO/RH  PREPARE FRESH FROZEN PLASMA    Imaging Review US Abdomen Complete  09/26/2014   CLINICAL DATA:  Right upper quadrant abdominal tenderness. History of alcohol blue abuse with known cirrhosis and esophageal varices. History of Mallory-Weiss tear approximately 3 months ago with subsequent upper GI bleed. Initial encounter.  EXAM: ULTRASOUND ABDOMEN COMPLETE  COMPARISON:  Abdominal ultrasound -  07/05/2014  FINDINGS: The examination is degraded due to patient body habitus and limited sonographic window.  Gallbladder: Several punctate echogenic gallstones are noted within otherwise normal-appearing gallbladder. Largest gallstone measures approximately 0.5 cm in diameter (image 32). No gallbladder wall thickening or pericholecystic fluid.  Common bile duct: Diameter: Normal in size measuring 2.9 mm in diameter  Liver: There is nodularity hepatic contour (image 19) compatible with provided history of hepatic cirrhosis. Moderate volume intra-abdominal ascites. No definite hepatic lesions. No definite intrahepatic biliary duct dilatation.  IVC: No abnormality visualized.  Pancreas: Limited visualization of the pancreatic head and neck is normal. Visualization of the pancreatic body and tail is obscured by bowel gas.  Spleen: Normal in size measuring 10.4 cm in length.  Right Kidney: Normal cortical thickness, echogenicity and size, measuring 11.5 cm in length. No focal renal lesions. No echogenic renal stones. No urinary obstruction.  Left Kidney: Length: Normal cortical thickness, echogenicity and size, measuring 11.0 cm in length. No focal renal lesions. No echogenic renal stones. No urinary obstruction.  Abdominal aorta: No aneurysm visualized.  Other findings: None.  IMPRESSION: 1. Cholelithiasis without evidence of cholecystitis. If concern persists for acute cholecystitis, further evaluation could be performed with HIDA scan as clinically indicated. 2. Nodularity of the hepatic contour compatible with provided history of hepatic steatosis. No discrete hepatic lesions. 3. Moderate volume intra-abdominal ascites.   Electronically Signed   By: Simonne Come M.D.   On: 09/26/2014 01:50   Dg Chest Portable 1 View  2014/10/21   CLINICAL DATA:  Atrial fibrillation.  GI bleeding.  Line placement.  EXAM: PORTABLE CHEST - 1 VIEW  COMPARISON:  21-Oct-2014.  FINDINGS: Right IJ line noted in good anatomic position.  Mediastinum and hilar structures are normal. Subsegmental atelectasis and/or scarring both lung bases. No pleural effusion or pneumothorax. Heart size normal. No acute bony abnormality.  IMPRESSION: 1. Right IJ line noted in good anatomic position with is tip projected over the superior vena cava. 2. Bibasilar subsegmental atelectasis and/or scarring.   Electronically Signed   By: Maisie Fus  Register   On: Oct 21, 2014 07:26   Dg Chest Portable 1 View  10/21/14   CLINICAL DATA:  Vomiting and atrial fibrillation. GI bleeding. Initial encounter.  EXAM: PORTABLE CHEST - 1 VIEW  COMPARISON:  09/25/2014.  FINDINGS: No cardiomegaly.  Normal aortic and hilar contours.  There is chronic interstitial coarsening with discrete scarring at the left base. Possible apical emphysematous change. Skin folds are noted, no  pneumothorax. No pneumonia, edema, or effusion.  IMPRESSION: Chronic bronchitic change and scarring.  No edema or pneumonia.   Electronically Signed   By: Tiburcio PeaJonathan  Watts M.D.   On: 2014/07/09 06:38   Dg Chest Port 1 View  09/25/2014   CLINICAL DATA:  Vomiting blood. Shortness of breath. Initial encounter  EXAM: PORTABLE CHEST - 1 VIEW  COMPARISON:  07/04/2014  FINDINGS: Normal heart size and mediastinal contours. There is chronic pulmonary hyperinflation and interstitial coarsening. Linear opacity at the left base is chronic and consistent with mild scarring. There is no edema, consolidation, effusion, or pneumothorax (skin folds noted). Remote bilateral rib fractures. No acute osseous findings.  IMPRESSION: Chronic bronchitic changes and left basilar scarring.   Electronically Signed   By: Tiburcio PeaJonathan  Watts M.D.   On: 09/25/2014 23:32     EKG Interpretation   Date/Time:  Tuesday September 27 2014 06:13:00 EDT Ventricular Rate:  176 PR Interval:    QRS Duration: 77 QT Interval:  275 QTC Calculation: 470 R Axis:   5 Text Interpretation:  Atrial fibrillation with rapid V-rate Low voltage,  extremity  leads ST depression, probably rate related Baseline wander in  lead(s) V3 Confirmed by Alaia Lordi  MD, Ojas Coone ([id number]) on 2014/07/09 8:11:47 AM     CRITICAL CARE Performed by: Olivia MackieTTER,Tymier Lindholm M Total critical care time: 90 min Critical care time was exclusive of separately billable procedures and treating other patients. Critical care was necessary to treat or prevent imminent or life-threatening deterioration. Critical care was time spent personally by me on the following activities: development of treatment plan with patient and/or surrogate as well as nursing, discussions with consultants, evaluation of patient's response to treatment, examination of patient, obtaining history from patient or surrogate, ordering and performing treatments and interventions, ordering and review of laboratory studies, ordering and review of radiographic studies, pulse oximetry and re-evaluation of patient's condition.  MDM   Final diagnoses:  Hemorrhagic shock  Acute GI bleeding  Liver failure  Alcohol abuse  Hypoglycemia   68 yo male with GI bleed, h/o cirrhosis, alcoholism, mallory weiss tear, esophageal varices.  Pt is critically ill, with multiple lab abnormalities.  Case discussed with critical care and with GI, DR Bucchini, who will see the patient in consult.  Prognosis is grim.   Olivia Mackielga M Marrissa Dai, MD 17-Jul-2014 [phone]

## 2014-10-16 NOTE — Code Documentation (Signed)
CODE BLUE NOTE  Patient Name: Brett KinsmanKenneth D Nott   MRN: [id number]   Date of Birth/ Sex: 06/05/1946 , male      Admission Date: 09/22/2014  Attending Provider: No att. providers found  Primary Diagnosis: <principal problem not specified>    Indication: Pt was in his usual state of health until this PM, when he was noted to be in a critical arrythmia. Was not able to obtain from the nurses/staff the reason for the code. Code Blue was subsequently called. At the time of arrival on scene, ACLS protocol was underway.    Technical Description:  - CPR performance duration:  4 minutes  - Was defibrillation or cardioversion used? No   - Was external pacer placed? No  - Was patient intubated pre/post CPR? Yes    Medications Administered: Y = Yes; Blank = No Amiodarone    Atropine    Calcium  X  Epinephrine  X  Lidocaine    Magnesium    Norepinephrine    Phenylephrine    Sodium bicarbonate  X  Vasopressin      Post CPR evaluation:  - Final Status - Was patient successfully resuscitated ? Yes - What is current rhythm? V. fib - What is current hemodynamic status? unknown   Miscellaneous Information:  - Labs sent, including: unknown  - Primary team notified?  Yes; CCM doctors on site and resumed care  - Family Notified? Yes  - Additional notes/ transfer status: Patient already in ICU. Care left to them once doctor arrived.     Physicians Responding to Code Blue: Evelena PeatAlex Wilson, Internal Medicine PGY-2 Caryl AdaJazma Amol Domanski, DO, Family Medicine PGY-1

## 2014-10-16 NOTE — Progress Notes (Addendum)
PULMONARY / CRITICAL CARE MEDICINE   Name: [name] MRN: [id number] DOB: Nov 22, 1946    ADMISSION DATE:  October 23, 2014  REFERRING MD :  EDP  CHIEF COMPLAINT:  Shock/gib  INITIAL PRESENTATION:  Hypotensive with GIB  STUDIES: USS Abd- 09/26/2014- No hepatic lesions, hepatic nodularity with steatosis, Mod vol ascites, cholelitiasis.  SIGNIFICANT EVENTS: 10/13 Gib 10/13 ETT, Code blue- resuscitated, ICU admit, pea 15 min in ED 10/13 [place], [place] 10/13 Franco Nones tube inserted  Brief HISTORY :  68 yo WM with ongoing ETOH abuse, alcoholic cirrhosis, recurrent GIB, non compliance , esophogeal varices and portal gastropathy who was seen (10/12) at [facility] for N/V and discharged. He has continued to drink and presented to [facility] ED 10/13 with UGIB, elevated INR, hypotension and bedbugs(Cinmex lectularius). He has been seen by GI in recent past. Pt was Code Blue in the ED and resuscitated for ~85mins. X Ray from 9/29 with distal radial fx.  PAST MEDICAL HISTORY :   has a past medical history of COPD (chronic obstructive pulmonary disease); Acid reflux; IBS (irritable bowel syndrome); Chronic back pain; Arthritis; and Collapsed lung.  has past surgical history that includes Skin graft; Skin graft; and Esophagogastroduodenoscopy (N/A, 07/04/2014).  SUBJECTIVE: Sedated on Versed. Intubated. Severe acidosis  VITAL SIGNS: Temp:  [92.8 F (33.8 C)-97.9 F (36.6 C)] 92.8 F (33.8 C) (10/13 0800) Pulse Rate:  [32-187] 36 (10/13 1100) Resp:  [15-30] 29 (10/13 0945) BP: (83-159)/(37-98) 159/91 mmHg (10/13 1100) SpO2:  [37 %-98 %] 75 % (10/13 1100) Arterial Line BP: (150-182)/(68-91) 171/91 mmHg (10/13 1100) FiO2 (%):  [100 %] 100 % (10/13 0929) HEMODYNAMICS:   VENTILATOR SETTINGS: Vent Mode:  [-] PRVC FiO2 (%):  [100 %] 100 % Set Rate:  [16 bmp] 16 bmp Vt Set:  [500 mL] 500 mL PEEP:  [5 cmH20] 5 cmH20 Plateau Pressure:  [30 cmH20] 30 cmH20 INTAKE / OUTPUT:  Intake/Output Summary  (Last 24 hours) at Oct 23, 2014 1134 Last data filed at Oct 23, 2014 1100  Gross per 24 hour  Intake 8580.83 ml  Output   1500 ml  Net 7080.83 ml    PHYSICAL EXAMINATION: General:  unkempt WM, not awake Neuro:  Sedated, rass -4 HEENT: Poor dentition, no jvd/lan  Cardiovascular:  Regular s1 s2 rrr no r Lungs: severe rhonchi  Abdomen:  Tense, distended, fluid wave, no r/g Musculoskeletal:  No edema Skin:  Dry, cool  LABS:  CBC  Recent Labs Lab 09/25/14 2241 10/23/2014 0615 10-23-2014 0626 10/23/14 1002  WBC 4.9 5.3  --  3.9*  HGB 8.4* 4.8* 6.1* 6.2*  HCT 25.6* 16.0* 18.0* 21.0*  PLT 177 156  --  53*   Coag's  Recent Labs Lab 10/23/14 0615 10/23/14 1002  APTT  --  86*  INR 3.81*  --    BMET  Recent Labs Lab 09/25/14 2241 10-23-2014 0615 10/23/2014 0626  NA 134* 139 134*  K 4.4 4.0 3.8  CL 94* 88* 93*  CO2 28 10*  --   BUN $Re'9 21 20  'tBD$ CREATININE 0.64 1.11 1.30  GLUCOSE 92 31* 30*   Electrolytes  Recent Labs Lab 09/25/14 2241 Oct 23, 2014 0615 October 23, 2014 1002  CALCIUM 8.4 8.3*  --   MG  --   --  1.9  PHOS  --   --  8.5*   Sepsis Markers  Recent Labs Lab Oct 23, 2014 0627 10-23-2014 1005  LATICACIDVEN >17.00* 26.1*   ABG  Recent Labs Lab 2014-10-23 1000 October 23, 2014 1123  PHART 6.632* 6.757*  PCO2ART  41.8 33.2*  PO2ART 487.0* 274.0*   Liver Enzymes  Recent Labs Lab 09/25/14 2241 October 18, 2014 0615  AST 47* 1339*  ALT 17 289*  ALKPHOS 199* 144*  BILITOT 1.4* 2.1*  ALBUMIN 3.1* 2.3*   Glucose  Recent Labs Lab 18-Oct-2014 0645 Oct 18, 2014 0729 18-Oct-2014 0929  GLUCAP 99 90 53*    Imaging US Abdomen Complete  09/26/2014   CLINICAL DATA:  Right upper quadrant abdominal tenderness.   IMPRESSION: 1. Cholelithiasis without evidence of cholecystitis. If concern persists for acute cholecystitis, further evaluation could be performed with HIDA scan as clinically indicated. 2. Nodularity of the hepatic contour compatible with provided history of hepatic steatosis. No  discrete hepatic lesions. 3. Moderate volume intra-abdominal ascites.   Electronically Signed   By: Sandi Mariscal M.D.   On: 09/26/2014 01:50    ASSESSMENT / PLAN:  PULMONARY OETT A: Acute respiratory failure- hypoxic COPD Cont tobacco abuse Severe uncompensated met acidosis, lacitic  P:   PRVC Scheduled BD's , rate to 35, abg reviewed, this is not survivable likely Reduce fio2, peep Daily chest Xrays ABG  pcxr for blackmore placement  CARDIOVASCULAR CVL10/13 rt i j >> Art Line- 10/13 Femoral central Line -10/18/14  A: Hypovolemic(blood loss) shock on pressors- Vasopressin Hx of HTN Hypothermic P:  PRBC replacement of blood loss Pressors- Vasopressin added for var bleed dosing 0.4 Hold antihypertensives Stat cbc after massive transfusion Evaluated line neck, line out to 14 cm, new neck swelling occurred, dc use as concern is infiltration iv meds  fluids, I US neck, fluid noted, not c/w hematoma Avoid gross cyrtsalloid resus, use blood Stat a line  RENAL  A:   Anion Gap ametabolic acidosis- Bicarb- 10, AG- 41, PH- 6.6 Lactic acidosis- 26 (Oct 18, 2014) At risk for AKI  P:   Stat Bmet and then frequent, high risk hypoerk Bicarb only if K rises, wil not change outcome in lactic acidosis Treat hypovolemic shock foley  GASTROINTESTINAL A:   Massive GIB, presume variceal  Cirrhosis GERD IBS   P:   Octreotide drip PPI GI following- Stabilize, start Octreotide, Antibiotics, will consider blakemore tube Blakemore tube inserted, placement confirmed with Xray. Tolerated well If able to stablize and improve from ph 6.6, could drop balloon in 24-48 hr and scope around it Maintain Hgb >8. CBGs- Q6H Have managed pressure in balloons, increased gesophageal for further tamponade afect  HEMATOLOGIC  Recent Labs  2014/10/18 0626 October 18, 2014 1002  HGB 6.1* 6.2*   Lab Results  Component Value Date   INR 3.81* 10/18/14   INR 2.16* 07/04/2014   INR 1.56* 01/07/2013     A:   Acute Blood loss anemia along with chronic anemia, hgb- 4.8 on admit Increased INR- 3.81  P:  Transfuse for hgb <8.0 in active GIB, & units of blood transfused FFP, 2 units completed so far.  Vit k  Repeat coags  INFECTIOUS A:   liver cirrhosis therefore will treat with roc., at risk SBP Bed bug infestation  P:   BCx2 10/13>> UC 10/13>> Sputum Abx: roc, start date10/13 >> Need to spray room for bed bugs If survives will diagnostic para  ENDOCRINE A:   Hypoglycemia- 53. P:   CBG Q4H D50 as needed Cortisol- in process, consider empiric stress roids lt  NEUROLOGIC A:   AMS- Acute metabolic encephalopathy, hepatic encpahlopathy- Ammonia- 105. Alcoholic- Blood alcohol 29 on admit, UDS- neg Anoxic brain injury  P:   RASS goal now to -5 as blackmore in , add nimbex to  prop (bp improved) and fent CIWA protocol dc Thiamine and folic acid Lactulose + rifaximin if pt improves  MS Assesment: Old fx left wrist Plan Resolve acute issues for now.  Family updated:  No family at bedside. Attempts to reach family unsuccessful- Emergency contact- Son. I eventually got in contact wife. Descibed circumstance, Appears NOt survivble. ACLS, shock medically futile She will come in now  Interdisciplinary Family Meeting v Palliative Care Meeting:    TODAY'S SUMMARY:  68 yo WM with ongoing ETOH abuse, alcoholic cirrhosis, recurrent GIB, non compliance , esophogeal varices and portal gastropathy presented today with massive GI bleed, hypotensive,  Resuscitated with severe metabolic acidosis. Arterial and central line placed today, blakemore tube also inserted. Prognosis poor. Goals of care discussion with Family.    Bing Neighbors, MD. PGY- 2 IMTS.   10-14-14, 11:34 AM  Lavon Paganini. Titus Mould, [place] Pgr: [facility]  I have personally obtained a history, examined the patient, evaluated laboratory and imaging results, formulated the  assessment and plan and placed orders. Was present during entire service as described  CRITICAL CARE: The patient is critically ill with multiple organ systems failure and requires high complexity decision making for assessment and support, frequent evaluation and titration of therapies, application of advanced monitoring technologies and extensive interpretation of multiple databases. Critical Care Time devoted to patient care services described in this note is 120 minutes was mine and independent of resident.   Lavon Paganini. Titus Mould, MD, [place] Pgr: [facility]

## 2014-10-16 NOTE — ED Notes (Signed)
Myself and Crecencio McNikki S, RN cleaned pt and changed stretcher linens

## 2014-10-16 NOTE — Consult Note (Signed)
[facility] Consult Note  Referring Provider: No ref. provider found Primary Care Physician:  No primary provider on file. Primary Gastroenterologist:  Dr.  Laurel Dimmer Complaint: Vomiting blood HPI: [name] is an 68 y.o. white male  with a history of known endstage cirrhosis with esophageal varices and continued drinking 2 was admitted with hematemesis and melena with systolic blood pressure of 50 tachycardia in the 80s requiring continued resuscitation. He presented with a hemoglobin of 4.4 and a pH of 6.6, and required intubation. We're consulted for massive GI bleed. He has known large esophageal varices and was last endoscoped in July with injection of what was felt to be a bleeding Mallory-Weiss tear at that time. He was intubated and the critical care physician tells me that he has basically been a CODE BLUE situation for approximately 15 minutes and resuscitative efforts still in progress..  Past Medical History  Diagnosis Date  . COPD (chronic obstructive pulmonary disease)   . Acid reflux   . IBS (irritable bowel syndrome)   . Chronic back pain   . Arthritis   . Collapsed lung     Past Surgical History  Procedure Laterality Date  . Skin graft    . Skin graft    . Esophagogastroduodenoscopy N/A 07/04/2014    Procedure: ESOPHAGOGASTRODUODENOSCOPY (EGD);  Surgeon: Lear Ng, MD;  Location: [facility];  Service: Endoscopy;  Laterality: N/A;    No prescriptions prior to admission    Allergies: No Known Allergies  History reviewed. No pertinent family history.  Social History:  reports that he has been smoking Cigarettes.  He has been smoking about 0.50 packs per day. He does not have any smokeless tobacco history on file. He reports that he drinks about 21 ounces of alcohol per week. He reports that he does not use illicit drugs.  Review of Systems: unobtainable  Blood pressure 144/83, pulse 106, temperature 97.9 F (36.6 C), temperature source  Oral, resp. rate 15, SpO2 93.00%. Unable to complete examination due to 2 resuscitative situation. He is quite pale and intubated with blood encrusted around his mouth. Abdomen is slightly distended   Results for orders placed during the hospital encounter of October 17, 2014 (from the past 48 hour(s))  AMMONIA     Status: Abnormal   Collection Time    10/17/2014  6:15 AM      Result Value Ref Range   Ammonia 105 (*) 11 - 60 umol/L  CBC WITH DIFFERENTIAL     Status: Abnormal   Collection Time    October 17, 2014  6:15 AM      Result Value Ref Range   WBC 5.3  4.0 - 10.5 K/uL   RBC 1.91 (*) 4.22 - 5.81 MIL/uL   Hemoglobin 4.8 (*) 13.0 - 17.0 g/dL   Comment: REPEATED TO VERIFY     CRITICAL RESULT CALLED TO, READ BACK BY AND VERIFIED WITH:     BECKY LAMBERT,RN AT 0701 10-17-14 BY K BARR   HCT 16.0 (*) 39.0 - 52.0 %   MCV 83.8  78.0 - 100.0 fL   MCH 25.1 (*) 26.0 - 34.0 pg   MCHC 30.0  30.0 - 36.0 g/dL   RDW 22.5 (*) 11.5 - 15.5 %   Platelets 156  150 - 400 K/uL   Neutrophils Relative % 77  43 - 77 %   Lymphocytes Relative 7 (*) 12 - 46 %   Monocytes Relative 16 (*) 3 - 12 %   Eosinophils Relative 0  0 -  5 %   Basophils Relative 0  0 - 1 %   Neutro Abs 4.1  1.7 - 7.7 K/uL   Lymphs Abs 0.4 (*) 0.7 - 4.0 K/uL   Monocytes Absolute 0.8  0.1 - 1.0 K/uL   Eosinophils Absolute 0.0  0.0 - 0.7 K/uL   Basophils Absolute 0.0  0.0 - 0.1 K/uL   RBC Morphology POLYCHROMASIA PRESENT     Comment: TARGET CELLS     ELLIPTOCYTES     BURR CELLS  COMPREHENSIVE METABOLIC PANEL     Status: Abnormal   Collection Time    10/27/2014  6:15 AM      Result Value Ref Range   Sodium 139  137 - 147 mEq/L   Potassium 4.0  3.7 - 5.3 mEq/L   Chloride 88 (*) 96 - 112 mEq/L   CO2 10 (*) 19 - 32 mEq/L   Comment: CRITICAL RESULT CALLED TO, READ BACK BY AND VERIFIED WITH:     SHERWOOD [facility] AT 0865 2014/10/27 BY ZBEECH.   Glucose, Bld 31 (*) 70 - 99 mg/dL   Comment: CRITICAL RESULT CALLED TO, READ BACK BY AND VERIFIED WITH:      [facility] SHERWOOD,RN AT 7846 10/27/2014 BY ZBEECH.   BUN 21  6 - 23 mg/dL   Creatinine, Ser 1.11  0.50 - 1.35 mg/dL   Calcium 8.3 (*) 8.4 - 10.5 mg/dL   Total Protein 5.3 (*) 6.0 - 8.3 g/dL   Albumin 2.3 (*) 3.5 - 5.2 g/dL   AST 1339 (*) 0 - 37 U/L   ALT 289 (*) 0 - 53 U/L   Alkaline Phosphatase 144 (*) 39 - 117 U/L   Total Bilirubin 2.1 (*) 0.3 - 1.2 mg/dL   GFR calc non Af Amer 67 (*) >90 mL/min   GFR calc Af Amer 78 (*) >90 mL/min   Comment: (NOTE)     The eGFR has been calculated using the CKD EPI equation.     This calculation has not been validated in all clinical situations.     eGFR's persistently <90 mL/min signify possible Chronic Kidney     Disease.   Anion gap 41 (*) 5 - 15  LIPASE, BLOOD     Status: None   Collection Time    October 27, 2014  6:15 AM      Result Value Ref Range   Lipase 47  11 - 59 U/L  PROTIME-INR     Status: Abnormal   Collection Time    Oct 27, 2014  6:15 AM      Result Value Ref Range   Prothrombin Time 37.5 (*) 11.6 - 15.2 seconds   INR 3.81 (*) 0.00 - 1.49  TYPE AND SCREEN     Status: None   Collection Time    10-27-2014  6:15 AM      Result Value Ref Range   ABO/RH(D) A POS     Antibody Screen NEG     Sample Expiration 09/30/2014     Unit Number [id number]     Blood Component Type RED CELLS,LR     Unit division 00     Status of Unit ISSUED     Unit tag comment VERBAL ORDERS PER DR OTTER     Transfusion Status OK TO TRANSFUSE     Crossmatch Result COMPATIBLE     Unit Number [id number]     Blood Component Type RBC CPDA1, LR     Unit division 00     Status of Unit ISSUED  Unit tag comment VERBAL ORDERS PER DR OTTER     Transfusion Status OK TO TRANSFUSE     Crossmatch Result COMPATIBLE     Unit Number [id number]     Blood Component Type RED CELLS,LR     Unit division 00     Status of Unit ALLOCATED     Transfusion Status OK TO TRANSFUSE     Crossmatch Result Compatible     Unit Number [id number]     Blood Component Type RED  CELLS,LR     Unit division 00     Status of Unit ALLOCATED     Transfusion Status OK TO TRANSFUSE     Crossmatch Result Compatible     Unit Number [id number]     Blood Component Type RED CELLS,LR     Unit division 00     Status of Unit ALLOCATED     Transfusion Status OK TO TRANSFUSE     Crossmatch Result Compatible     Unit Number [id number]     Blood Component Type RED CELLS,LR     Unit division 00     Status of Unit ISSUED     Unit tag comment VERBAL ORDERS PER DR KOHUT     Transfusion Status OK TO TRANSFUSE     Crossmatch Result COMPATIBLE     Unit Number [id number]     Blood Component Type RED CELLS,LR     Unit division 00     Status of Unit ISSUED     Unit tag comment VERBAL ORDERS PER DR KOHUT     Transfusion Status OK TO TRANSFUSE     Crossmatch Result COMPATIBLE    ETHANOL     Status: Abnormal   Collection Time    2014/10/06  6:15 AM      Result Value Ref Range   Alcohol, Ethyl (B) 29 (*) 0 - 11 mg/dL   Comment:            LOWEST DETECTABLE LIMIT FOR     SERUM ALCOHOL IS 11 mg/dL     FOR MEDICAL PURPOSES ONLY  PREPARE RBC (CROSSMATCH)     Status: None   Collection Time    Oct 06, 2014  6:15 AM      Result Value Ref Range   Order Confirmation ORDER PROCESSED BY BLOOD BANK    ABO/RH     Status: None   Collection Time    10/06/2014  6:15 AM      Result Value Ref Range   ABO/RH(D) A POS    I-STAT CHEM 8, ED     Status: Abnormal   Collection Time    06-Oct-2014  6:26 AM      Result Value Ref Range   Sodium 134 (*) 137 - 147 mEq/L   Potassium 3.8  3.7 - 5.3 mEq/L   Chloride 93 (*) 96 - 112 mEq/L   BUN 20  6 - 23 mg/dL   Creatinine, Ser 1.30  0.50 - 1.35 mg/dL   Glucose, Bld 30 (*) 70 - 99 mg/dL   Calcium, Ion 0.93 (*) 1.13 - 1.30 mmol/L   TCO2 11  0 - 100 mmol/L   Hemoglobin 6.1 (*) 13.0 - 17.0 g/dL   HCT 18.0 (*) 39.0 - 52.0 %   Comment NOTIFIED PHYSICIAN    I-STAT TROPOININ, ED     Status: None   Collection Time    06-Oct-2014  6:26 AM      Result Value  Ref Range  Troponin i, poc 0.04  0.00 - 0.08 ng/mL   Comment 3            Comment: Due to the release kinetics of cTnI,     a negative result within the first hours     of the onset of symptoms does not rule out     myocardial infarction with certainty.     If myocardial infarction is still suspected,     repeat the test at appropriate intervals.  I-STAT CG4 LACTIC ACID, ED     Status: Abnormal   Collection Time    October 19, 2014  6:27 AM      Result Value Ref Range   Lactic Acid, Venous >17.00 (*) 0.5 - 2.2 mmol/L  CBG MONITORING, ED     Status: None   Collection Time    October 19, 2014  6:45 AM      Result Value Ref Range   Glucose-Capillary 99  70 - 99 mg/dL  CBG MONITORING, ED     Status: None   Collection Time    2014/10/19  7:29 AM      Result Value Ref Range   Glucose-Capillary 90  70 - 99 mg/dL   Comment 1 Notify RN     Comment 2 Documented in Chart    URINALYSIS, ROUTINE W REFLEX MICROSCOPIC     Status: Abnormal   Collection Time    October 19, 2014  7:50 AM      Result Value Ref Range   Color, Urine YELLOW  YELLOW   APPearance CLOUDY (*) CLEAR   Specific Gravity, Urine 1.013  1.005 - 1.030   pH 5.0  5.0 - 8.0   Glucose, UA NEGATIVE  NEGATIVE mg/dL   Hgb urine dipstick MODERATE (*) NEGATIVE   Bilirubin Urine NEGATIVE  NEGATIVE   Ketones, ur 15 (*) NEGATIVE mg/dL   Protein, ur NEGATIVE  NEGATIVE mg/dL   Urobilinogen, UA 0.2  0.0 - 1.0 mg/dL   Nitrite NEGATIVE  NEGATIVE   Leukocytes, UA NEGATIVE  NEGATIVE  URINE RAPID DRUG SCREEN (HOSP PERFORMED)     Status: None   Collection Time    10/19/14  7:50 AM      Result Value Ref Range   Opiates NONE DETECTED  NONE DETECTED   Cocaine NONE DETECTED  NONE DETECTED   Benzodiazepines NONE DETECTED  NONE DETECTED   Amphetamines NONE DETECTED  NONE DETECTED   Tetrahydrocannabinol NONE DETECTED  NONE DETECTED   Barbiturates NONE DETECTED  NONE DETECTED   Comment:            DRUG SCREEN FOR MEDICAL PURPOSES     ONLY.  IF CONFIRMATION IS  NEEDED     FOR ANY PURPOSE, NOTIFY LAB     WITHIN 5 DAYS.                LOWEST DETECTABLE LIMITS     FOR URINE DRUG SCREEN     Drug Class       Cutoff (ng/mL)     Amphetamine      1000     Barbiturate      200     Benzodiazepine   391     Tricyclics       225     Opiates          300     Cocaine          300     THC  50  URINE MICROSCOPIC-ADD ON     Status: Abnormal   Collection Time    09/26/2014  7:50 AM      Result Value Ref Range   Squamous Epithelial / LPF RARE  RARE   WBC, UA 0-2  <3 WBC/hpf   RBC / HPF 0-2  <3 RBC/hpf   Bacteria, UA RARE  RARE   Casts HYALINE CASTS (*) NEGATIVE  PREPARE FRESH FROZEN PLASMA     Status: None   Collection Time    09/22/2014  7:55 AM      Result Value Ref Range   Unit Number [id number]     Blood Component Type THAWED PLASMA     Unit division 00     Status of Unit ISSUED     Transfusion Status OK TO TRANSFUSE     Unit Number [id number]     Blood Component Type THAWED PLASMA     Unit division 00     Status of Unit ISSUED     Transfusion Status OK TO TRANSFUSE    GLUCOSE, CAPILLARY     Status: Abnormal   Collection Time    10/05/2014  9:29 AM      Result Value Ref Range   Glucose-Capillary 53 (*) 70 - 99 mg/dL   Comment 1 Notify RN     Comment 2 Documented in Chart    PREPARE FRESH FROZEN PLASMA     Status: None   Collection Time    September 28, 2014  9:30 AM      Result Value Ref Range   Unit Number [id number]     Blood Component Type THAWED PLASMA     Unit division 00     Status of Unit ALLOCATED     Transfusion Status OK TO TRANSFUSE     Unit Number [id number]     Blood Component Type THAWED PLASMA     Unit division 00     Status of Unit ALLOCATED     Transfusion Status OK TO TRANSFUSE    POCT I-STAT 3, ART BLOOD GAS (G3+)     Status: Abnormal   Collection Time    10/07/2014 10:00 AM      Result Value Ref Range   pH, Arterial 6.632 (*) 7.350 - 7.450   pCO2 arterial 41.8  35.0 - 45.0 mmHg   pO2, Arterial 487.0  (*) 80.0 - 100.0 mmHg   Bicarbonate 4.4 (*) 20.0 - 24.0 mEq/L   TCO2 6  0 - 100 mmol/L   O2 Saturation 100.0     Acid-base deficit 30.0 (*) 0.0 - 2.0 mmol/L   Patient temperature 98.6 F     Collection site ARTERIAL LINE     Drawn by Operator     Sample type ARTERIAL     Comment NOTIFIED PHYSICIAN     US Abdomen Complete  09/26/2014   CLINICAL DATA:  Right upper quadrant abdominal tenderness. History of alcohol blue abuse with known cirrhosis and esophageal varices. History of Mallory-Weiss tear approximately 3 months ago with subsequent upper GI bleed. Initial encounter.  EXAM: ULTRASOUND ABDOMEN COMPLETE  COMPARISON:  Abdominal ultrasound - 07/05/2014  FINDINGS: The examination is degraded due to patient body habitus and limited sonographic window.  Gallbladder: Several punctate echogenic gallstones are noted within otherwise normal-appearing gallbladder. Largest gallstone measures approximately 0.5 cm in diameter (image 32). No gallbladder wall thickening or pericholecystic fluid.  Common bile duct: Diameter: Normal in size measuring 2.9 mm in diameter  Liver: There is nodularity  hepatic contour (image 19) compatible with provided history of hepatic cirrhosis. Moderate volume intra-abdominal ascites. No definite hepatic lesions. No definite intrahepatic biliary duct dilatation.  IVC: No abnormality visualized.  Pancreas: Limited visualization of the pancreatic head and neck is normal. Visualization of the pancreatic body and tail is obscured by bowel gas.  Spleen: Normal in size measuring 10.4 cm in length.  Right Kidney: Normal cortical thickness, echogenicity and size, measuring 11.5 cm in length. No focal renal lesions. No echogenic renal stones. No urinary obstruction.  Left Kidney: Length: Normal cortical thickness, echogenicity and size, measuring 11.0 cm in length. No focal renal lesions. No echogenic renal stones. No urinary obstruction.  Abdominal aorta: No aneurysm visualized.  Other  findings: None.  IMPRESSION: 1. Cholelithiasis without evidence of cholecystitis. If concern persists for acute cholecystitis, further evaluation could be performed with HIDA scan as clinically indicated. 2. Nodularity of the hepatic contour compatible with provided history of hepatic steatosis. No discrete hepatic lesions. 3. Moderate volume intra-abdominal ascites.   Electronically Signed   By: Sandi Mariscal M.D.   On: 09/26/2014 01:50   Dg Chest Portable 1 View  10/16/14   CLINICAL DATA:  Atrial fibrillation.  GI bleeding.  Line placement.  EXAM: PORTABLE CHEST - 1 VIEW  COMPARISON:  10-16-14.  FINDINGS: Right IJ line noted in good anatomic position. Mediastinum and hilar structures are normal. Subsegmental atelectasis and/or scarring both lung bases. No pleural effusion or pneumothorax. Heart size normal. No acute bony abnormality.  IMPRESSION: 1. Right IJ line noted in good anatomic position with is tip projected over the superior vena cava. 2. Bibasilar subsegmental atelectasis and/or scarring.   Electronically Signed   By: Marcello Moores  Register   On: 10/16/14 07:26   Dg Chest Portable 1 View  2014-10-16   CLINICAL DATA:  Vomiting and atrial fibrillation. GI bleeding. Initial encounter.  EXAM: PORTABLE CHEST - 1 VIEW  COMPARISON:  09/25/2014.  FINDINGS: No cardiomegaly.  Normal aortic and hilar contours.  There is chronic interstitial coarsening with discrete scarring at the left base. Possible apical emphysematous change. Skin folds are noted, no pneumothorax. No pneumonia, edema, or effusion.  IMPRESSION: Chronic bronchitic change and scarring.  No edema or pneumonia.   Electronically Signed   By: Jorje Guild M.D.   On: 2014/10/16 06:38   Dg Chest Port 1 View  09/25/2014   CLINICAL DATA:  Vomiting blood. Shortness of breath. Initial encounter  EXAM: PORTABLE CHEST - 1 VIEW  COMPARISON:  07/04/2014  FINDINGS: Normal heart size and mediastinal contours. There is chronic pulmonary hyperinflation  and interstitial coarsening. Linear opacity at the left base is chronic and consistent with mild scarring. There is no edema, consolidation, effusion, or pneumothorax (skin folds noted). Remote bilateral rib fractures. No acute osseous findings.  IMPRESSION: Chronic bronchitic changes and left basilar scarring.   Electronically Signed   By: Jorje Guild M.D.   On: 09/25/2014 23:32    Assessment: 1. Massive upper GI bleeding with severe hemodynamic instability severe metabolic acidosis coagulopathy and anemia, critically ill at this time. Plan:   I discussed acute management with critical care team he was still actively resuscitate patient. We have elected to acutely place a Blakemore tube while trying to stabilize patient's blood pressure and treat acidosis and other life-threatening issues before attempting EGD. Will begin octreotide and empiric antibiotics. Prognosis appears quite poor at the moment. If stabilizes to significant degree may consider deflating Blakemore tube and consider upper endoscopy possibly with banding  of varices. Will follow with you. VDFPB,HEBB C 10/20/2014, 10:09 AM

## 2014-10-16 NOTE — Procedures (Signed)
Procedure: ACLS  Pt suffered acute UGIB c/b hemorrhagic shock and pulselessness. Chest compressions initiated, pt intubated and epinephrine administered with restoration of pulse. Pt transported emergently to ICU. ACLS was administered under my direction and constant supervision   Billy Fischeravid Broedy Osbourne, MD ; [facility] Mobile [phone].  After 5:30 PM or weekends, call [phone]

## 2014-10-16 NOTE — Progress Notes (Signed)
Chaplain's note:  Responded to code blue. Following family conversation with PA about patient's condition I Met with patient's ex-wife, son, sister and brother-in-law. Wife and son were both tearful and talked about their relationship and love for patient. They both acknowledged that while they do not want to lose him, he would not want to live in this state. Wife spoke of how he still called her everyday, and son felt his Dad was always there " for him." They requested that I pray for him, for his peace and God's acceptance and gratitude for him.  Will pass on request for support to night chaplain.  Sue Lush [phone]

## 2014-10-16 NOTE — Care Management Note (Signed)
    Page 1 of 1   01/26/14     9:40:08 AM CARE MANAGEMENT NOTE 01/26/14  Patient:  [name],Brett D   Account Number:  [id number]  Date Initiated:  01/26/14  Documentation initiated by:  Junius CreamerWELL,DEBBIE  Subjective/Objective Assessment:   adm w shock from bleeding     Action/Plan:   lives w wife   Anticipated DC Date:     Anticipated DC Plan:    In-house referral  Clinical Social Worker         Choice offered to / List presented to:             Status of service:   Medicare Important Message given?   (If response is "NO", the following Medicare IM given date fields will be blank) Date Medicare IM given:   Medicare IM given by:   Date Additional Medicare IM given:   Additional Medicare IM given by:    Discharge Disposition:    Per UR Regulation:  Reviewed for med. necessity/level of care/duration of stay  If discussed at Long Length of Stay Meetings, dates discussed:    Comments:

## 2014-10-16 NOTE — Procedures (Signed)
Central Venous Catheter Insertion Procedure Note Brett KinsmanKenneth D Reyes [id number] 12/18/1945  Procedure: Insertion of Central Venous Catheter Indications: Assessment of intravascular volume, Drug and/or fluid administration and Frequent blood sampling  Procedure Details Consent: Unable to obtain consent because of altered level of consciousness. Time Out: Verified patient identification, verified procedure, site/side was marked, verified correct patient position, special equipment/implants available, medications/allergies/relevent history reviewed, required imaging and test results available.  Performed  Maximum sterile technique was used including antiseptics, cap, gloves, gown, hand hygiene, mask and sheet. Skin prep: Chlorhexidine; local anesthetic administered A antimicrobial bonded/coated single lumen catheter was placed in the right femoral vein due to emergent situation using the Seldinger technique.  Evaluation Blood flow good Complications: No apparent complications Patient did tolerate procedure well. Chest X-ray ordered to verify placement.  CXR: normal.  [name] 10/08/2014, 10:54 AM  US Emergent [name] [name], MD, FACP Pgr: [phone] [facility] '

## 2014-10-16 NOTE — ED Notes (Signed)
Dr Norlene Campbellotter at bedside to insert central line

## 2014-10-16 NOTE — Procedures (Signed)
Oral Intubation Procedure Note  Indications: cardiopulmonary arrest Consent: Unable to obtain   Pre-meds: none  Neuromuscular blockade: none  Laryngoscope: #4 MAC  Visualization: airway poorly visualized due to oropharynx filled with blood  ETT: 7.5 ETT passed on first attempt and secured @ 24 cm at upper gums  Findings:    Evaluation:  CXR revealed proper position of ETT   Billy Fischeravid Simonds, MD ; [facility] [phone].  After 5:30 PM or weekends, call [phone]

## 2014-10-16 NOTE — ED Notes (Signed)
cbg 30, 1 amp d50 being given per dr Remonia Richterotters order

## 2014-10-16 NOTE — Progress Notes (Signed)
1718 time of expiration. Pupils fixed and dilated size 6. Non reactive to light. No heart tones noted via auscultation. Cardiac monitor asystole. Ventilator stopped no breath sounds noted via auscultation.  Verified by two RN's Manon Hilding(Jonovan Boedecker, RN and Lamount CrankerKelli Hunt, RN).  Family at bedside. Wife and son called to bedside. All belongings given to family to take home. [facility] notified. OK to release body per [facility].

## 2014-10-16 NOTE — ED Notes (Signed)
Pt vomitted large amount of dark red emisis. Pt became unresponsive. Dr simonds at bedside. Pt suctioned. Compressions started by wendy rn. Dr simonds attempting to intubate pt.

## 2014-10-16 NOTE — ED Notes (Signed)
Pt taken by this rn, RT and sharita, emt. Bedside report given to 2100.

## 2014-10-16 NOTE — ED Notes (Signed)
Pt alert and asking for water. Pt informed that he cannot drink at this time. Will continue to monitor

## 2014-10-16 NOTE — ED Notes (Addendum)
Pt began vomiting dark red emisis. Pt suctioned multiple times. Dr. Sung Amabilesimonds called into room. Respiratory at bedside. Pt placed on NRB  Preparing for intubation.

## 2014-10-16 NOTE — Procedures (Signed)
CPR Note  Patient became hypotensive, bradycardic and PEA.  Epi and bicarb given, ACLS protocol followed, please see code note.  Alyson ReedyWesam G. Siyon Linck, M.D. [facility]. Pager: [phone]. After hours pager: [phone].

## 2014-10-16 NOTE — Progress Notes (Signed)
PCCM Interval Progress Note  Extensive bedside discussion with family (pt's wife, son, sister, and brother in law) regarding the extremely poor prognosis of [name] following cardiac arrest x 2 in the setting of hemorrhagic shock from GI bleed. The family has decided to change code status to DNR.  They have requested that we maintain current treatments (but not escalate therapy); however, in the event that Mr. Pennelope BrackenDoss's heart were to stop again, we would not perform ACLS protocol.  They have been fully updated on the process and expectations and all questions have been answered.  Additional CC time: 30 min.  Rutherford Guysahul Desai, PA - [facility] Pgr: [phone]  or [phone]  Patient seen and examined, agree with above note.  I dictated the care and orders written for this patient under my direction.  Alyson ReedyWesam G Yacoub, MD [phone]

## 2014-10-16 NOTE — Procedures (Signed)
Arterial Catheter Insertion Procedure Note Brett KinsmanKenneth D Reyes [id number] 03/06/1946  Procedure: Insertion of Arterial Catheter  Indications: Blood pressure monitoring and Frequent blood sampling  Procedure Details Consent: Unable to obtain consent because of emergent medical necessity. Time Out: Verified patient identification, verified procedure, site/side was marked, verified correct patient position, special equipment/implants available, medications/allergies/relevent history reviewed, required imaging and test results available.  Performed  Maximum sterile technique was used including antiseptics, cap, gloves, gown, hand hygiene, mask and sheet. Skin prep: Chlorhexidine; local anesthetic administered 20 gauge catheter was inserted into right femoral artery using the Seldinger technique.  Evaluation Blood flow good; BP tracing good. Complications: No apparent complications.   [name], [name] 13-Apr-2014  perfromed with US Mcarthur Rossettianiel J. Tyson [name], MD, FACP Pgr: [phone] [facility]

## 2014-10-16 NOTE — Procedures (Addendum)
Blackmore tube placement  refratory variseal bleeding Acidosis, lactic, blood rapid Transfusion Ph 6.6 Unstable for endo  Placed blackmoe tube 55 cm, inflated 300 cc without resistance into stomach Pulled up a10 cm felt tub, confirmed in stomach Inflated esophageal balloon 100 cc, no res RN to set up foot ball helmit equivalent 600cc back also from stomach asp port, old blood Tolerated well pcxr confirmed well tolerated  Mcarthur Rossettianiel J. Tyson [name], MD, FACP Pgr: [phone] [facility]

## 2014-10-16 NOTE — Progress Notes (Signed)
Chaplain paged to be with family as patient passed. More family arriving as present with family.   Notified family of Chaplain services and availability and facilitated memory and narrative review with a few of the family members.   Chaplain noted funeral arrangements and contact information from pt's wife and sister. Chaplain forwarded information to Nurse.   Page if needed. [phone]  Gala RomneyBrown, Dametri Ozburn J, Chaplain 09/20/2014

## 2014-10-16 NOTE — ED Notes (Signed)
Pt arrived by [facility] from home. Reports n/v and coffee ground emesis. HR 160-200 and irregular pta. +etoh today. [facility] and dc home. Pt is pale, abd distended on arrival. Last bp for ems was 93/69.

## 2014-10-16 NOTE — H&P (Signed)
PULMONARY / CRITICAL CARE MEDICINE   Name: Brett KinsmanKenneth D Eldridge MRN: [id number] DOB: 12/08/1946    ADMISSION DATE:  02-20-2014   REFERRING MD :  EDP  CHIEF COMPLAINT:  Shock/gib  INITIAL PRESENTATION:  Hypotensive with GIB  STUDIES:    SIGNIFICANT EVENTS: 10/13 gib   HISTORY OF PRESENT ILLNESS:  68 yo WM with ongoing ETOH abuse, alcoholic cirrhosis, recurrent GIB, non compliance , esophogeal varices and portal gastropathy who [facility] (10-12) [facility] and discharged. He has continued to drink and [facility] with UGIB, elevated INR, hypotension and bedbugs(Cinmex lectularius). He has been seen by GI in recent past and they are in route to see him. He has a dirty worn cast on the left arm with  X Ray from 9/29 with distal radial fx,  PAST MEDICAL HISTORY :   has a past medical history of COPD (chronic obstructive pulmonary disease); Acid reflux; IBS (irritable bowel syndrome); Chronic back pain; Arthritis; and Collapsed lung.  has past surgical history that includes Skin graft; Skin graft; and Esophagogastroduodenoscopy (N/A, 07/04/2014). Prior to Admission medications   Not on File   No Known Allergies  FAMILY HISTORY:  has no family status information on file.  SOCIAL HISTORY:  reports that he has been smoking Cigarettes.  He has been smoking about 0.50 packs per day. He does not have any smokeless tobacco history on file. He reports that he drinks about 21 ounces of alcohol per week. He reports that he does not use illicit drugs.  REVIEW OF SYSTEMS: NA  SUBJECTIVE:   VITAL SIGNS: Temp:  [97.9 F (36.6 C)] 97.9 F (36.6 C) (10/13 0645) Pulse Rate:  [159-187] 159 (10/13 0700) Resp:  [20-23] 23 (10/13 0630) BP: (83-102)/(37-46) 102/41 mmHg (10/13 0700) SpO2:  [93 %-98 %] 93 % (10/13 0700) HEMODYNAMICS:   VENTILATOR SETTINGS:   INTAKE / OUTPUT:  Intake/Output Summary (Last 24 hours) at 03/21/14 0732 Last data filed at 03/21/14 0716  Gross  per 24 hour  Intake   2200 ml  Output      0 ml  Net   2200 ml    PHYSICAL EXAMINATION: General:  Slovenly appearing WM, non cooperative Neuro:  MAEx 4, non cooperative HEENT: Poor dentition, no jvd/lan  Cardiovascular:  HSIR  Lungs: mild rhonchi  Abdomen:  Tense, tender Musculoskeletal:  Left forearm cast(dirty) Skin:  Dry, cool  LABS:  CBC  Recent Labs Lab 09/25/14 2241 03/21/14 0615 03/21/14 0626  WBC 4.9 5.3  --   HGB 8.4* 4.8* 6.1*  HCT 25.6* 16.0* 18.0*  PLT 177 156  --    Coag's  Recent Labs Lab 03/21/14 0615  INR 3.81*   BMET  Recent Labs Lab 09/25/14 2241 03/21/14 0615 03/21/14 0626  NA 134* 139 134*  K 4.4 4.0 3.8  CL 94* 88* 93*  CO2 28 10*  --   BUN 9 21 20   CREATININE 0.64 1.11 1.30  GLUCOSE 92 31* 30*   Electrolytes  Recent Labs Lab 09/25/14 2241 03/21/14 0615  CALCIUM 8.4 8.3*   Sepsis Markers  Recent Labs Lab 03/21/14 0627  LATICACIDVEN >17.00*   ABG No results found for this basename: PHART, PCO2ART, PO2ART,  in the last 168 hours Liver Enzymes  Recent Labs Lab 09/25/14 2241 03/21/14 0615  AST 47* 1339*  ALT 17 289*  ALKPHOS 199* 144*  BILITOT 1.4* 2.1*  ALBUMIN 3.1* 2.3*   Cardiac Enzymes No results found for this basename:  TROPONINI, PROBNP,  in the last 168 hours Glucose  Recent Labs Lab April 11, 2014 0729  GLUCAP 90    Imaging [place] Abdomen Complete  09/26/2014   CLINICAL DATA:  Right upper quadrant abdominal tenderness. History of alcohol blue abuse with known cirrhosis and esophageal varices. History of Mallory-Weiss tear approximately 3 months ago with subsequent upper GI bleed. Initial encounter.  EXAM: ULTRASOUND ABDOMEN COMPLETE  COMPARISON:  Abdominal ultrasound - 07/05/2014  FINDINGS: The examination is degraded due to patient body habitus and limited sonographic window.  Gallbladder: Several punctate echogenic gallstones are noted within otherwise normal-appearing gallbladder. Largest gallstone  measures approximately 0.5 cm in diameter (image 32). No gallbladder wall thickening or pericholecystic fluid.  Common bile duct: Diameter: Normal in size measuring 2.9 mm in diameter  Liver: There is nodularity hepatic contour (image 19) compatible with provided history of hepatic cirrhosis. Moderate volume intra-abdominal ascites. No definite hepatic lesions. No definite intrahepatic biliary duct dilatation.  IVC: No abnormality visualized.  Pancreas: Limited visualization of the pancreatic head and neck is normal. Visualization of the pancreatic body and tail is obscured by bowel gas.  Spleen: Normal in size measuring 10.4 cm in length.  Right Kidney: Normal cortical thickness, echogenicity and size, measuring 11.5 cm in length. No focal renal lesions. No echogenic renal stones. No urinary obstruction.  Left Kidney: Length: Normal cortical thickness, echogenicity and size, measuring 11.0 cm in length. No focal renal lesions. No echogenic renal stones. No urinary obstruction.  Abdominal aorta: No aneurysm visualized.  Other findings: None.  IMPRESSION: 1. Cholelithiasis without evidence of cholecystitis. If concern persists for acute cholecystitis, further evaluation could be performed with HIDA scan as clinically indicated. 2. Nodularity of the hepatic contour compatible with provided history of hepatic steatosis. No discrete hepatic lesions. 3. Moderate volume intra-abdominal ascites.   Electronically Signed   By: Simonne ComeJohn  Watts M.D.   On: 09/26/2014 01:50     ASSESSMENT / PLAN:  PULMONARY OETT A: COPD Cont tobacco abuse May need intubation for invasive procedures P:   O2 as needed BD's ? Intubation for invasive procedures  CARDIOVASCULAR CVL10/13 rt i j >> A: Hypovolemic(blood loss) shock Hx of HTN P:  PRBC replacement of blood loss Pressors as needed Hold antihypertensives  RENAL  A:   No acute issue P:     GASTROINTESTINAL A:   Recurrent GIB Cirrhosis GERD IBS  P:    Octreotide drip PPI GI consult (Bucchini aware) HEMATOLOGIC  Recent Labs  April 11, 2014 0615 April 11, 2014 0626  HGB 4.8* 6.1*   Lab Results  Component Value Date   INR 3.81* 01-31-14   INR 2.16* 07/04/2014   INR 1.56* 01/07/2013     A:   Blood loss anemia along with chronic anemia P:  Transfuse for hgb <8.0 in active GIB Correct coags  INFECTIOUS A:   No over infection but he has cirrhosis therefore will treat with roc. Note he has bedbug infestation  P:   BCx2 10/13>> UC 10/13>> Sputum Abx: roc, start date10/13, day 0/x Need to spay room for bed bugs  ENDOCRINE A:   Hypoglycemia P:   CBG's D50 as needed  NEUROLOGIC A:   AMS, most likely combination of ETOH use and metabolic state P:   RASS goal: 2 CIWA protocol as tolerated Thiamine and folic acid Consider CT head with elevated INR if mental status declines  MS Assesment:  Old fx left wrist Plan Continue soft cast left arm   Family updated:  No family at bedside  Interdisciplinary Family Meeting v Palliative Care Meeting:    TODAY'S SUMMARY:  68 yo WM with ongoing ETOH abuse, alcoholic cirrhosis, recurrent GIB, non compliance , esophogeal varices and portal gastropathy who was seen yesterday (10-12) at [facility] for N/V and discharged. He has continued to drink and presents to [facility] ED 10/13 with UGIB, elevated INR, hypotension and bedbugs(Cinmex lectularius). PCCM asked to admit.   Brett Canales Minor ACNP [facility] PCCM Pager [phone] till 3 pm If no answer page [phone] 10-04-2014, 7:38 AM   I have personally obtained a history, examined the patient, evaluated laboratory and imaging results, formulated the assessment and plan and placed orders. CRITICAL CARE: The patient is critically ill with multiple organ systems failure and requires high complexity decision making for assessment and support, frequent evaluation and titration of therapies, application of advanced monitoring technologies and extensive  interpretation of multiple databases. Critical Care Time devoted to patient care services described in this note is 40 minutes.   Billy Fischer, MD ; [facility] [phone].  After 5:30 PM or weekends, call [phone] Pulmonary and Critical Care Medicine [facility] Pager: [phone]  10/04/2014, 7:32 AM

## 2014-10-16 DEATH — deceased

## 2015-05-08 IMAGING — CR DG CHEST 1V PORT
1 series · 1 of 1 positions shown · non-contrast
Comparison: Chest x-ray dated 09/27/2014

CLINICAL DATA: Acute respiratory distress. Distended abdomen.
Massive GI bleed. Esophageal varices. End stage cirrhosis.

EXAM:
PORTABLE CHEST - 1 VIEW

[portable]
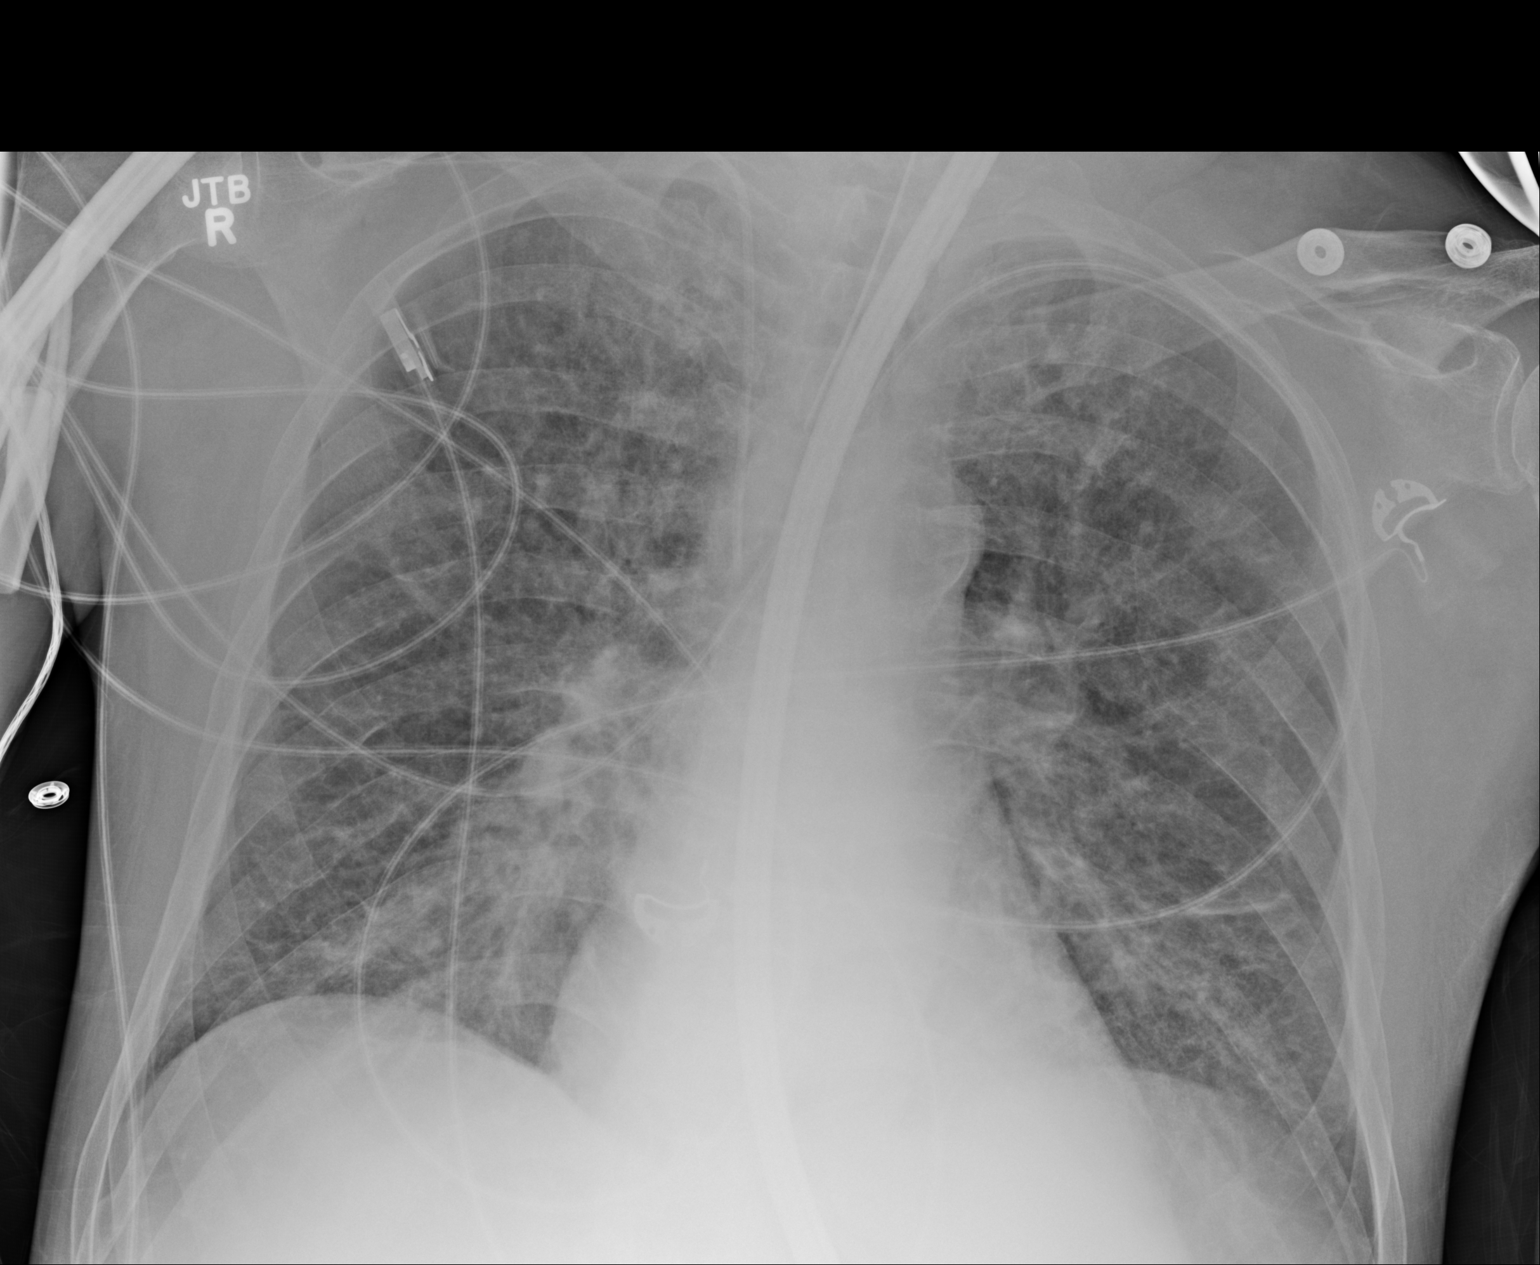

[1 of 1 positions shown; findings below may reference images not displayed]

FINDINGS: Central venous catheter tip is in the superior vena cava at the
level of the azygos vein. There is what appears to be an
endotracheal tube at the level of the thoracic inlet. There is a
large bore tube in the esophagus. The tip is below the diaphragm.

Heart size and pulmonary vascularity are normal. There is increased
accentuation of the interstitial markings consistent with slight
interstitial pulmonary edema.
IMPRESSION: Endotracheal tube tip is at the level of the thoracic inlet. New
interstitial pulmonary edema.

## 2015-05-08 IMAGING — CR DG CHEST 1V PORT
1 series · 1 of 1 positions shown · non-contrast
Comparison: 09/25/2014.

CLINICAL DATA: Vomiting and atrial fibrillation. GI bleeding.
Initial encounter.

EXAM:
PORTABLE CHEST - 1 VIEW

[AP]
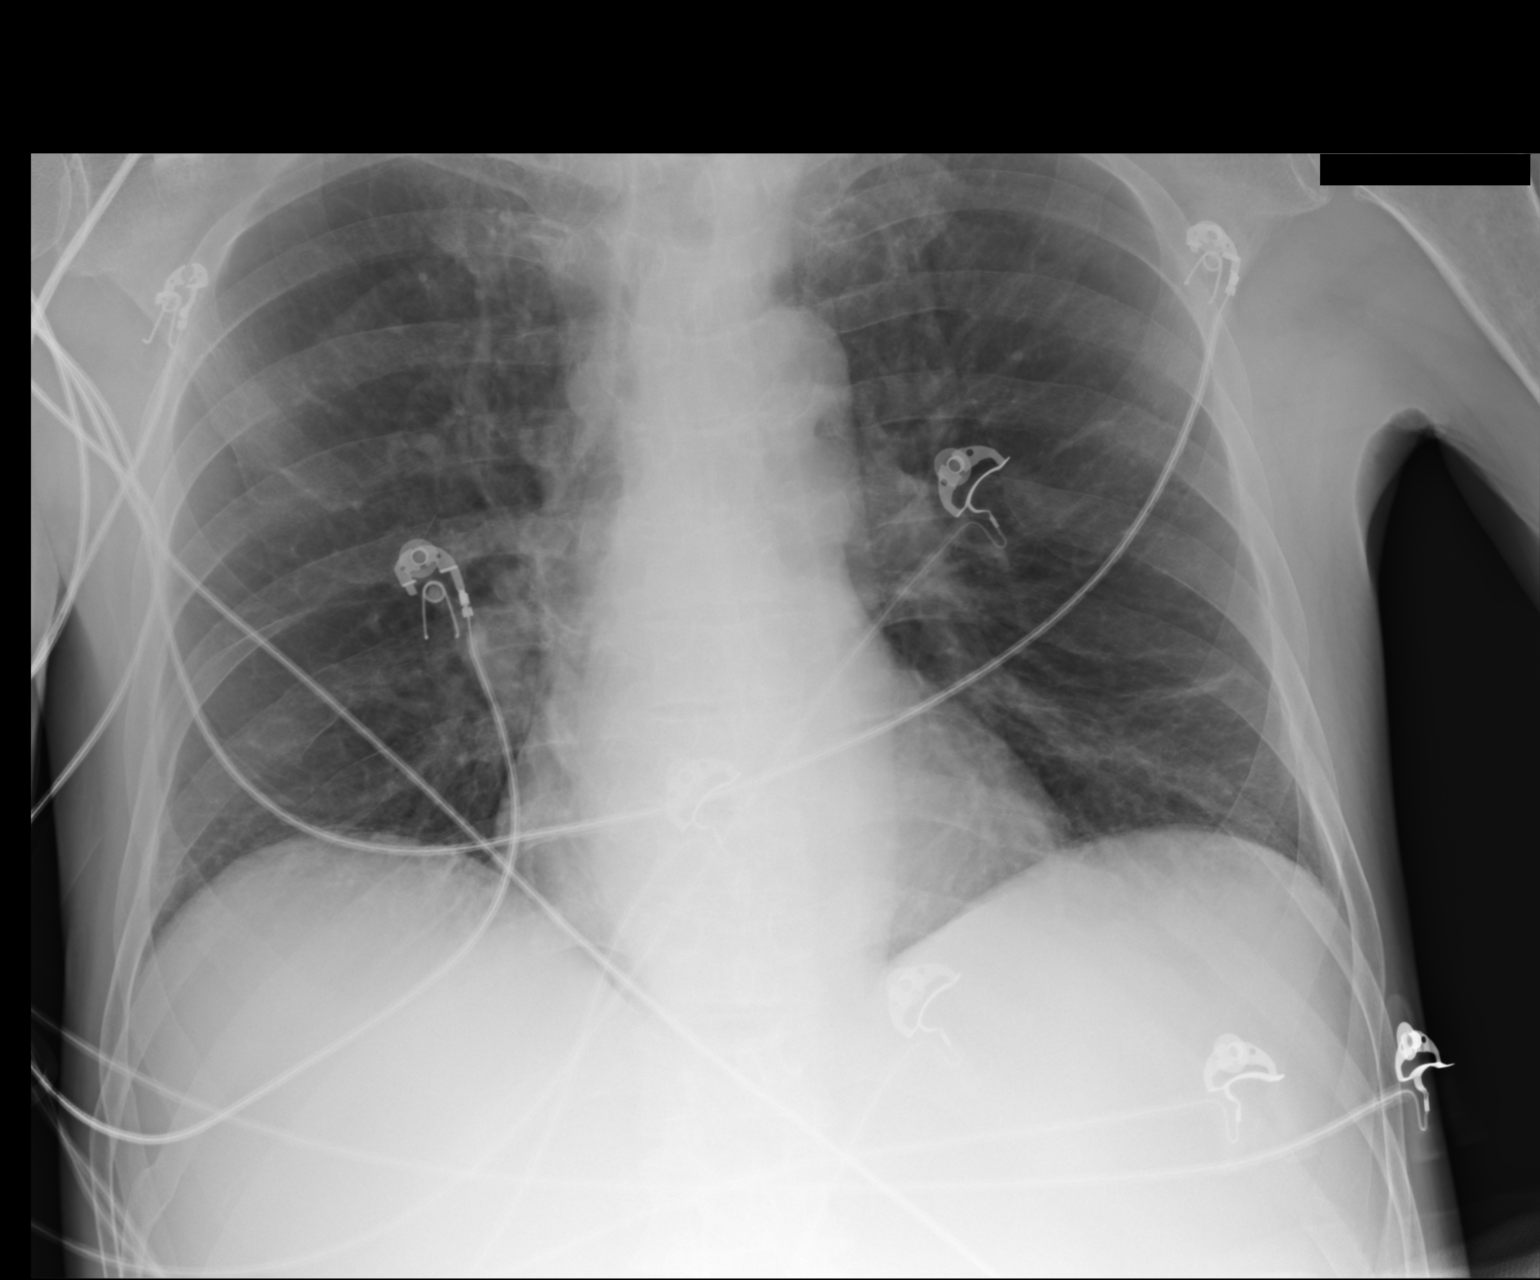

[1 of 1 positions shown; findings below may reference images not displayed]

FINDINGS: No cardiomegaly.  Normal aortic and hilar contours.

There is chronic interstitial coarsening with discrete scarring at
the left base. Possible apical emphysematous change. Skin folds are
noted, no pneumothorax. No pneumonia, edema, or effusion.
IMPRESSION: Chronic bronchitic change and scarring.  No edema or pneumonia.

## 2015-05-08 IMAGING — CR DG ABD PORTABLE 2V
2 series · 2 of 2 positions shown · non-contrast
Comparison: Radiograph dated 07/04/2014

CLINICAL DATA: Massive GI bleed.

EXAM:
PORTABLE ABDOMEN - 2 VIEW

[supine ap (1 of 2)]
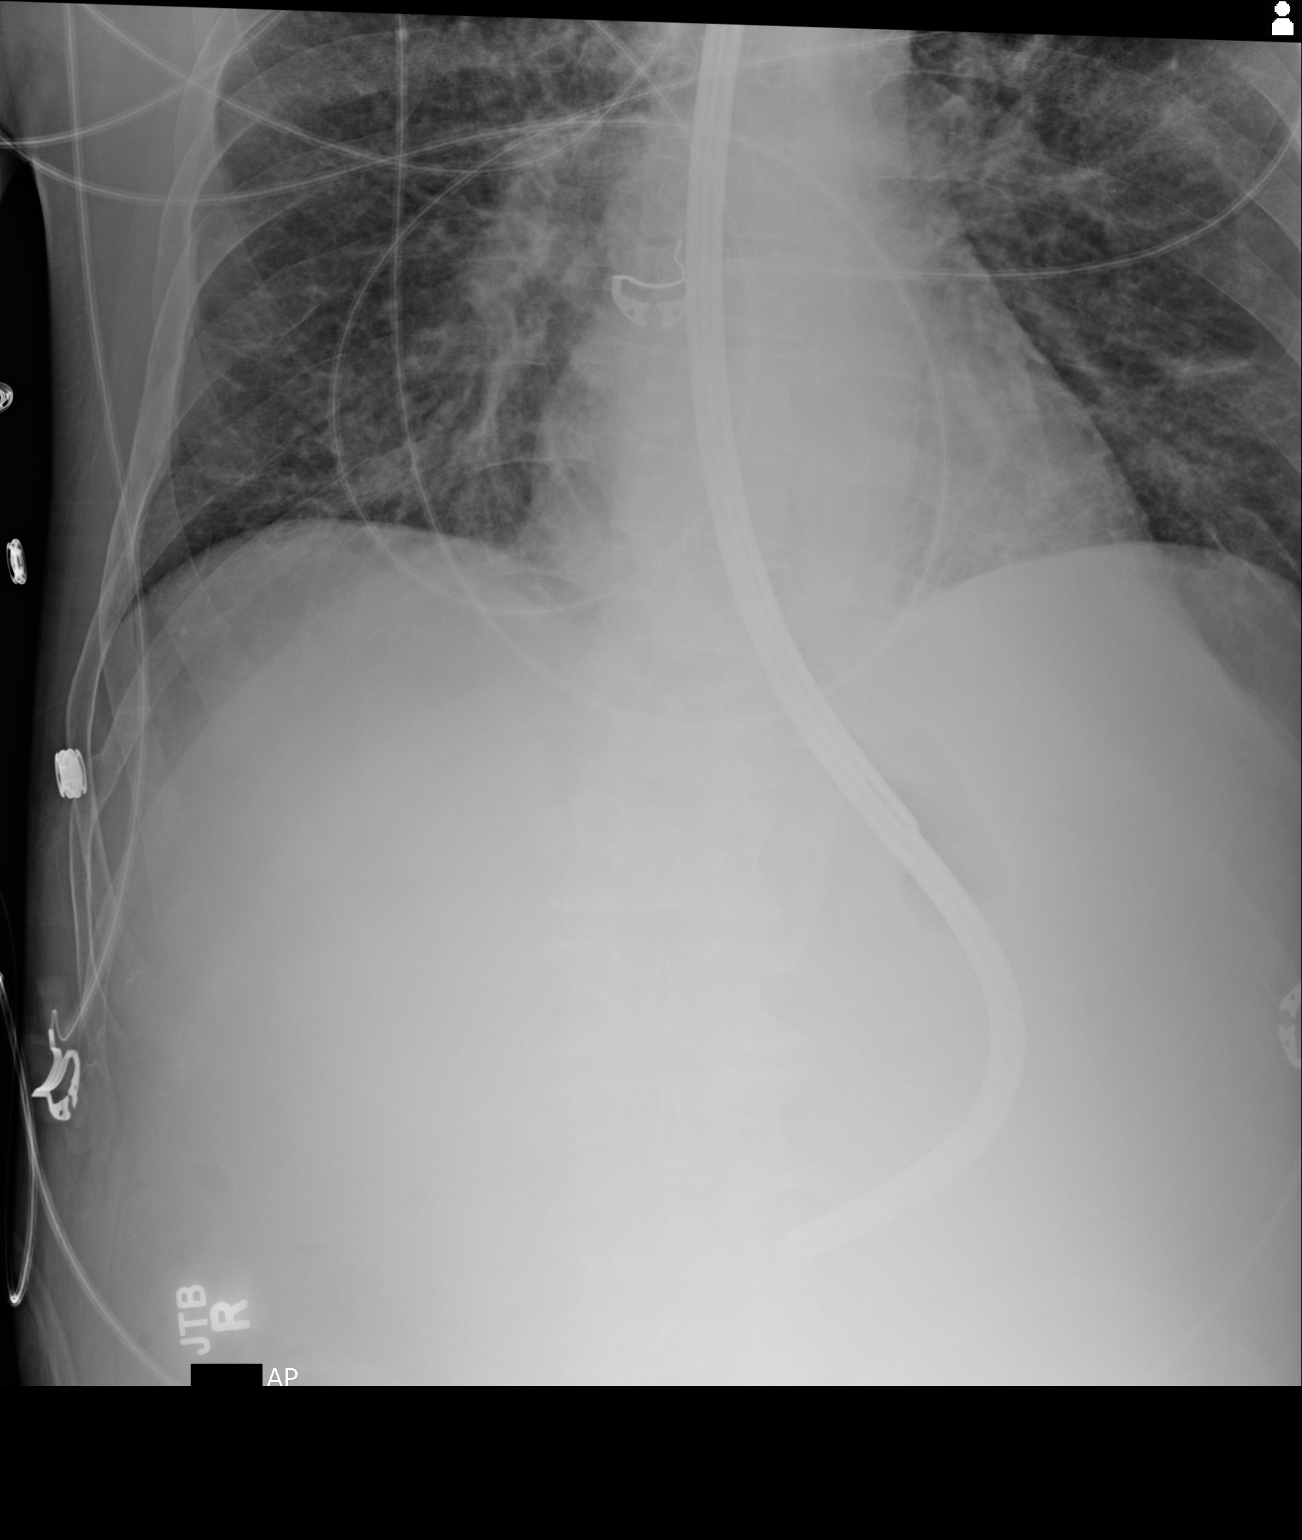

[supine ap (2 of 2)]
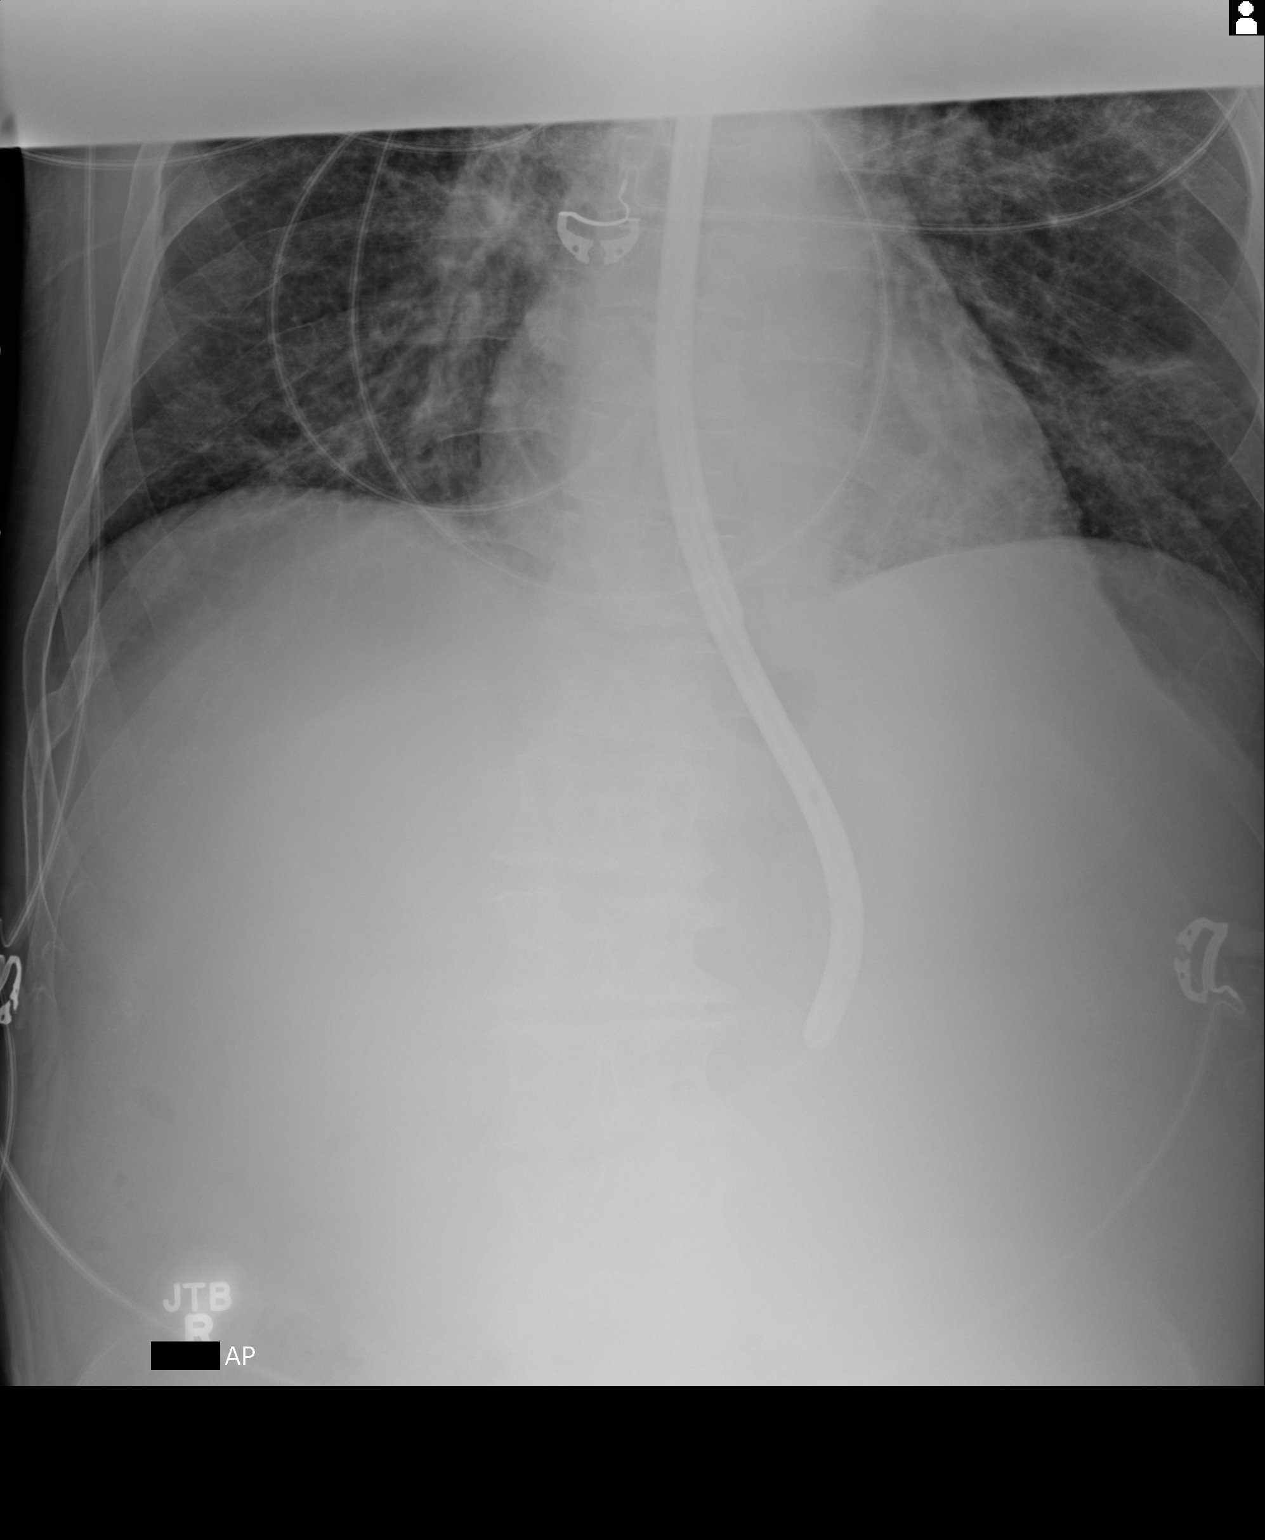

[2 of 2 positions shown; findings below may reference images not displayed]

FINDINGS: Blakemore tube has been inserted and the tip is in the antrum of the
stomach. There is bilateral slight interstitial pulmonary edema.
Overall density abdomen is increased consistent with ascites. No
dilated bowel.
IMPRESSION: Blakemore tube tip in the distal stomach. Ascites. Interstitial
pulmonary edema.
